# Patient Record
Sex: Female | Born: 1985 | Race: White | Hispanic: No | Marital: Married | Smoking: Never smoker
Health system: Southern US, Community
[De-identification: ages and names within clinical notes are randomized; demographics above are authoritative.]

## PROBLEM LIST (undated history)

## (undated) DIAGNOSIS — D649 Anemia, unspecified: Secondary | ICD-10-CM

## (undated) DIAGNOSIS — Z789 Other specified health status: Secondary | ICD-10-CM

## (undated) HISTORY — PX: WISDOM TOOTH EXTRACTION: SHX21

---

## 2003-12-08 ENCOUNTER — Other Ambulatory Visit: Admission: RE | Admit: 2003-12-08 | Discharge: 2003-12-08 | Payer: Self-pay | Admitting: Family Medicine

## 2004-03-03 ENCOUNTER — Other Ambulatory Visit: Admission: RE | Admit: 2004-03-03 | Discharge: 2004-03-03 | Payer: Self-pay | Admitting: Family Medicine

## 2004-12-06 ENCOUNTER — Other Ambulatory Visit: Admission: RE | Admit: 2004-12-06 | Discharge: 2004-12-06 | Payer: Self-pay | Admitting: Family Medicine

## 2005-12-12 ENCOUNTER — Ambulatory Visit: Payer: Self-pay | Admitting: Family Medicine

## 2005-12-12 ENCOUNTER — Other Ambulatory Visit: Admission: RE | Admit: 2005-12-12 | Discharge: 2005-12-12 | Payer: Self-pay | Admitting: Family Medicine

## 2006-02-15 ENCOUNTER — Ambulatory Visit: Payer: Self-pay | Admitting: Family Medicine

## 2006-06-16 ENCOUNTER — Ambulatory Visit: Payer: Self-pay | Admitting: Family Medicine

## 2006-12-27 ENCOUNTER — Other Ambulatory Visit: Admission: RE | Admit: 2006-12-27 | Discharge: 2006-12-27 | Payer: Self-pay | Admitting: Family Medicine

## 2006-12-27 ENCOUNTER — Ambulatory Visit: Payer: Self-pay | Admitting: Family Medicine

## 2007-12-31 ENCOUNTER — Other Ambulatory Visit: Admission: RE | Admit: 2007-12-31 | Discharge: 2007-12-31 | Payer: Self-pay | Admitting: Family Medicine

## 2007-12-31 ENCOUNTER — Ambulatory Visit: Payer: Self-pay | Admitting: Family Medicine

## 2008-01-02 ENCOUNTER — Encounter: Admission: RE | Admit: 2008-01-02 | Discharge: 2008-01-02 | Payer: Self-pay | Admitting: Family Medicine

## 2008-06-09 ENCOUNTER — Ambulatory Visit: Payer: Self-pay | Admitting: Family Medicine

## 2008-11-27 ENCOUNTER — Ambulatory Visit: Payer: Self-pay | Admitting: Family Medicine

## 2008-12-31 ENCOUNTER — Ambulatory Visit: Payer: Self-pay | Admitting: Physician Assistant

## 2008-12-31 ENCOUNTER — Other Ambulatory Visit: Admission: RE | Admit: 2008-12-31 | Discharge: 2008-12-31 | Payer: Self-pay | Admitting: Physician Assistant

## 2009-12-31 ENCOUNTER — Ambulatory Visit: Payer: Self-pay | Admitting: Physician Assistant

## 2009-12-31 ENCOUNTER — Other Ambulatory Visit
Admission: RE | Admit: 2009-12-31 | Discharge: 2009-12-31 | Payer: Self-pay | Source: Home / Self Care | Admitting: Family Medicine

## 2010-06-20 ENCOUNTER — Emergency Department (HOSPITAL_BASED_OUTPATIENT_CLINIC_OR_DEPARTMENT_OTHER)
Admission: EM | Admit: 2010-06-20 | Discharge: 2010-06-20 | Disposition: A | Discharge status: Home / Self Care | Payer: Managed Care, Other (non HMO) | Attending: Emergency Medicine | Admitting: Emergency Medicine

## 2010-06-20 DIAGNOSIS — L255 Unspecified contact dermatitis due to plants, except food: Secondary | ICD-10-CM | POA: Insufficient documentation

## 2010-06-20 DIAGNOSIS — R220 Localized swelling, mass and lump, head: Secondary | ICD-10-CM | POA: Insufficient documentation

## 2010-12-01 ENCOUNTER — Telehealth: Payer: Self-pay | Admitting: Family Medicine

## 2010-12-01 ENCOUNTER — Telehealth: Payer: Self-pay | Admitting: Internal Medicine

## 2010-12-01 MED ORDER — NORGESTIM-ETH ESTRAD TRIPHASIC 0.18/0.215/0.25 MG-35 MCG PO TABS: 1.0000 | ORAL_TABLET | Freq: Every day | ORAL | Status: DC

## 2010-12-01 NOTE — Telephone Encounter (Signed)
Don't let her run out but she needs an appointment 

## 2010-12-01 NOTE — Telephone Encounter (Signed)
Pt refilled tri-sprintec

## 2010-12-01 NOTE — Telephone Encounter (Signed)
Refill req for tri sprintec mg tab #84

## 2010-12-01 NOTE — Telephone Encounter (Signed)
I refilled tri-sprintec not pt refilled.

## 2012-12-21 ENCOUNTER — Ambulatory Visit (HOSPITAL_COMMUNITY)
Admission: RE | Admit: 2012-12-21 | Discharge: 2012-12-21 | Disposition: A | Discharge status: Home / Self Care | Payer: Managed Care, Other (non HMO) | Source: Ambulatory Visit | Attending: Obstetrics and Gynecology | Admitting: Obstetrics and Gynecology

## 2014-03-28 ENCOUNTER — Inpatient Hospital Stay (HOSPITAL_COMMUNITY)
Admission: AD | Admit: 2014-03-28 | Discharge: 2014-03-29 | Disposition: A | Discharge status: Home / Self Care | Payer: Managed Care, Other (non HMO) | Source: Ambulatory Visit | Attending: Obstetrics and Gynecology | Admitting: Obstetrics and Gynecology

## 2014-03-28 ENCOUNTER — Encounter (HOSPITAL_COMMUNITY): Payer: Self-pay | Admitting: *Deleted

## 2014-03-28 DIAGNOSIS — Z3A34 34 weeks gestation of pregnancy: Secondary | ICD-10-CM | POA: Diagnosis not present

## 2014-03-28 DIAGNOSIS — O9989 Other specified diseases and conditions complicating pregnancy, childbirth and the puerperium: Secondary | ICD-10-CM | POA: Diagnosis not present

## 2014-03-28 DIAGNOSIS — R030 Elevated blood-pressure reading, without diagnosis of hypertension: Secondary | ICD-10-CM | POA: Diagnosis not present

## 2014-03-28 DIAGNOSIS — IMO0001 Reserved for inherently not codable concepts without codable children: Secondary | ICD-10-CM

## 2014-03-28 HISTORY — DX: Other specified health status: Z78.9

## 2014-03-28 LAB — URINALYSIS, ROUTINE W REFLEX MICROSCOPIC
Bilirubin Urine: NEGATIVE
GLUCOSE, UA: NEGATIVE mg/dL
HGB URINE DIPSTICK: NEGATIVE
KETONES UR: NEGATIVE mg/dL
Leukocytes, UA: NEGATIVE
Nitrite: NEGATIVE
PROTEIN: NEGATIVE mg/dL
Specific Gravity, Urine: <1.005 — ABNORMAL LOW (ref 1.005–1.030)
UROBILINOGEN UA: 0.2 mg/dL (ref 0.0–1.0)
pH: 6.5 (ref 5.0–8.0)

## 2014-03-28 NOTE — MAU Note (Addendum)
Swelling in feet for couple wks. Tonight has moved up past my knees and my b/p was high tonight. This is first time my b/p has been high. Baby is breech

## 2014-03-29 LAB — COMPREHENSIVE METABOLIC PANEL
ALBUMIN: 2.7 g/dL — AB (ref 3.5–5.2)
ALK PHOS: 150 U/L — AB (ref 39–117)
ALT: 13 U/L (ref 0–35)
ANION GAP: 8 (ref 5–15)
AST: 21 U/L (ref 0–37)
BUN: 7 mg/dL (ref 6–23)
CALCIUM: 8.6 mg/dL (ref 8.4–10.5)
CO2: 23 mmol/L (ref 19–32)
CREATININE: 0.48 mg/dL — AB (ref 0.50–1.10)
Chloride: 107 mmol/L (ref 96–112)
GFR calc non Af Amer: >90 mL/min (ref >90)
Glucose, Bld: 93 mg/dL (ref 70–99)
POTASSIUM: 3.6 mmol/L (ref 3.5–5.1)
Sodium: 138 mmol/L (ref 135–145)
Total Bilirubin: 0.5 mg/dL (ref 0.3–1.2)
Total Protein: 6 g/dL (ref 6.0–8.3)

## 2014-03-29 LAB — CBC
HCT: 29 % — ABNORMAL LOW (ref 36.0–46.0)
Hemoglobin: 9.9 g/dL — ABNORMAL LOW (ref 12.0–15.0)
MCH: 29.3 pg (ref 26.0–34.0)
MCHC: 34.1 g/dL (ref 30.0–36.0)
MCV: 85.8 fL (ref 78.0–100.0)
Platelets: 128 10*3/uL — ABNORMAL LOW (ref 150–400)
RBC: 3.38 MIL/uL — ABNORMAL LOW (ref 3.87–5.11)
RDW: 13.6 % (ref 11.5–15.5)
WBC: 9.7 10*3/uL (ref 4.0–10.5)

## 2014-03-29 LAB — URIC ACID: URIC ACID, SERUM: 4.4 mg/dL (ref 2.4–7.0)

## 2014-03-29 LAB — PROTEIN / CREATININE RATIO, URINE
CREATININE, URINE: 14.52 mg/dL
Total Protein, Urine: <6 mg/dL

## 2014-03-29 LAB — LACTATE DEHYDROGENASE: LDH: 164 U/L (ref 94–250)

## 2014-03-29 NOTE — MAU Provider Note (Signed)
History     CSN: [id number]  Arrival date and time: 03/28/14 2237   First Provider Initiated Contact with Patient 03/29/14 0013      Chief Complaint  Patient presents with  . Leg Swelling  . Hypertension   HPI  Sandy Ramirez is a 29 y.o. female G1P0 at 58w6dwho presents with increased bilateral leg swelling and elevated BP readings at home. She borrowed a BP cuff from a friend and checked two readings that were both greater than 140/90's.  + fetal movement Denies vaginal bleeding    OB History    Gravida Para Term Preterm AB TAB SAB Ectopic Multiple Living   1               Past Medical History  Diagnosis Date  . Medical history non-contributory     Past Surgical History  Procedure Laterality Date  . Wisdom tooth extraction      Family History  Problem Relation Age of Onset  . Diabetes Father     History  Substance Use Topics  . Smoking status: Never Smoker   . Smokeless tobacco: Never Used  . Alcohol Use: No    Allergies:  Allergies  Allergen Reactions  . Grapefruit Concentrate Other (See Comments)    Unknown reaction  . Peanut-Containing Drug Products Other (See Comments)    Unknown reaction  . Pineapple Swelling  . Shellfish Allergy Swelling  . Wheat Bran Swelling    Prescriptions prior to admission  Medication Sig Dispense Refill Last Dose  . Norgestimate-Ethinyl Estradiol Triphasic (ORTHO TRI-CYCLEN,TRINESSA,TRI-SPRINTEC) 0.18/0.215/0.25 MG-35 MCG tablet Take 1 tablet by mouth daily. 1 Package 0    Results for orders placed or performed during the hospital encounter of 03/28/14 (from the past 48 hour(s))  Urinalysis, Routine w reflex microscopic     Status: Abnormal   Collection Time: 03/28/14 10:55 PM  Result Value Ref Range   Color, Urine YELLOW YELLOW   APPearance CLEAR CLEAR   Specific Gravity, Urine <1.005 (L) 1.005 - 1.030   pH 6.5 5.0 - 8.0   Glucose, UA NEGATIVE NEGATIVE mg/dL   Hgb urine dipstick NEGATIVE NEGATIVE    Bilirubin Urine NEGATIVE NEGATIVE   Ketones, ur NEGATIVE NEGATIVE mg/dL   Protein, ur NEGATIVE NEGATIVE mg/dL   Urobilinogen, UA 0.2 0.0 - 1.0 mg/dL   Nitrite NEGATIVE NEGATIVE   Leukocytes, UA NEGATIVE NEGATIVE    Comment: MICROSCOPIC NOT DONE ON URINES WITH NEGATIVE PROTEIN, BLOOD, LEUKOCYTES, NITRITE, OR GLUCOSE <1000 mg/dL.  Protein / creatinine ratio, urine     Status: None   Collection Time: 03/28/14 10:55 PM  Result Value Ref Range   Creatinine, Urine 14.52 mg/dL   Total Protein, Urine <6.00 mg/dL    Comment: REPEATED TO VERIFY NO NORMAL RANGE ESTABLISHED FOR THIS TEST    Protein Creatinine Ratio        0.00 - 0.15    Comment: RESULT BELOW REPORTABLE RANGE, UNABLE TO CALCULATE.   CBC     Status: Abnormal   Collection Time: 03/28/14 11:59 PM  Result Value Ref Range   WBC 9.7 4.0 - 10.5 K/uL   RBC 3.38 (L) 3.87 - 5.11 MIL/uL   Hemoglobin 9.9 (L) 12.0 - 15.0 g/dL   HCT 29.0 (L) 36.0 - 46.0 %   MCV 85.8 78.0 - 100.0 fL   MCH 29.3 26.0 - 34.0 pg   MCHC 34.1 30.0 - 36.0 g/dL   RDW 13.6 11.5 - 15.5 %   Platelets 128 (  L) 150 - 400 K/uL  Comprehensive metabolic panel     Status: Abnormal   Collection Time: 03/28/14 11:59 PM  Result Value Ref Range   Sodium 138 135 - 145 mmol/L   Potassium 3.6 3.5 - 5.1 mmol/L   Chloride 107 96 - 112 mmol/L   CO2 23 19 - 32 mmol/L   Glucose, Bld 93 70 - 99 mg/dL   BUN 7 6 - 23 mg/dL   Creatinine, Ser 0.48 (L) 0.50 - 1.10 mg/dL   Calcium 8.6 8.4 - 10.5 mg/dL   Total Protein 6.0 6.0 - 8.3 g/dL   Albumin 2.7 (L) 3.5 - 5.2 g/dL   AST 21 0 - 37 U/L   ALT 13 0 - 35 U/L   Alkaline Phosphatase 150 (H) 39 - 117 U/L   Total Bilirubin 0.5 0.3 - 1.2 mg/dL   GFR calc non Af Amer >90 >90 mL/min   GFR calc Af Amer >90 >90 mL/min    Comment: (NOTE) The eGFR has been calculated using the CKD EPI equation. This calculation has not been validated in all clinical situations. eGFR's persistently <90 mL/min signify possible Chronic Kidney Disease.     Anion gap 8 5 - 15  Uric acid     Status: None   Collection Time: 03/28/14 11:59 PM  Result Value Ref Range   Uric Acid, Serum 4.4 2.4 - 7.0 mg/dL  Lactate dehydrogenase     Status: None   Collection Time: 03/28/14 11:59 PM  Result Value Ref Range   LDH 164 94 - 250 U/L        Review of Systems  Constitutional: Negative for fever and chills.  Eyes: Negative for blurred vision and double vision.  Cardiovascular: Negative for chest pain.  Gastrointestinal: Negative for abdominal pain.  Neurological: Negative for headaches.   Physical Exam   Blood pressure 123/79, pulse 93, temperature 98.6 F (37 C), temperature source Oral, resp. rate 16, height $RemoveBe'5\' 6"'gfRVkerNT$  (1.676 m), weight 72.938 kg (160 lb 12.8 oz), SpO2 98 %.  Today's Vitals   03/29/14 0030 03/29/14 0035 03/29/14 0036 03/29/14 0136  BP:   127/80 123/79  Pulse: 97 95 90 93  Temp:    98.6 F (37 C)  TempSrc:    Oral  Resp:    16  Height:      Weight:      SpO2: 100% 98%        Physical Exam  Constitutional: She is oriented to person, place, and time. She appears well-developed and well-nourished.  HENT:  Head: Normocephalic.  Eyes: Pupils are equal, round, and reactive to light.  Neck: Neck supple.  Cardiovascular: Normal rate.   Respiratory: Effort normal. No respiratory distress.  GI: Soft. She exhibits no distension. There is no tenderness.  Musculoskeletal: Normal range of motion.       Right ankle: She exhibits swelling (+1 pitting edema ).       Left ankle: She exhibits swelling (+ 1 pitting edema ).  Neurological: She is alert and oriented to person, place, and time. She has normal reflexes.  Skin: Skin is warm.  Psychiatric: Her behavior is normal.   Fetal Tracing: Baseline: 120 bpm  Variability: Moderate  Accelerations: 15x15 Decelerations: none Toco: none    MAU Course  Procedures  None  MDM  CBC CMET Uric Acid LDH  UA protien creatine ratio; normal  NST  BP readings  Q15  Discussed patient with Dr. Melba Coon BP readings normal prior to  DC home   Assessment and Plan   A:  1. Elevated BP     P:  Discharge home in stable condition Preeclampsia precautions Kick counts Follow up with Dr. Melba Coon as scheduled Return to MAU if symptoms worsen   Lezlie Lye, NP 03/29/2014 4:10 AM

## 2014-04-02 ENCOUNTER — Inpatient Hospital Stay (HOSPITAL_COMMUNITY): Payer: Managed Care, Other (non HMO)

## 2014-04-02 ENCOUNTER — Inpatient Hospital Stay (HOSPITAL_COMMUNITY)
Admission: AD | Admit: 2014-04-02 | Discharge: 2014-04-04 | DRG: 766 | Disposition: A | Discharge status: Home / Self Care | Payer: Managed Care, Other (non HMO) | Source: Ambulatory Visit | Attending: Obstetrics and Gynecology | Admitting: Obstetrics and Gynecology

## 2014-04-02 ENCOUNTER — Inpatient Hospital Stay (HOSPITAL_COMMUNITY): Payer: Managed Care, Other (non HMO) | Admitting: Anesthesiology

## 2014-04-02 ENCOUNTER — Encounter (HOSPITAL_COMMUNITY): Payer: Self-pay | Admitting: *Deleted

## 2014-04-02 ENCOUNTER — Encounter (HOSPITAL_COMMUNITY)
Admission: AD | Disposition: A | Discharge status: Home / Self Care | Payer: Self-pay | Source: Ambulatory Visit | Attending: Obstetrics and Gynecology

## 2014-04-02 DIAGNOSIS — Z98891 History of uterine scar from previous surgery: Secondary | ICD-10-CM

## 2014-04-02 DIAGNOSIS — Z3A35 35 weeks gestation of pregnancy: Secondary | ICD-10-CM | POA: Insufficient documentation

## 2014-04-02 DIAGNOSIS — Z3689 Encounter for other specified antenatal screening: Secondary | ICD-10-CM | POA: Insufficient documentation

## 2014-04-02 DIAGNOSIS — O321XX0 Maternal care for breech presentation, not applicable or unspecified: Secondary | ICD-10-CM | POA: Diagnosis present

## 2014-04-02 DIAGNOSIS — D259 Leiomyoma of uterus, unspecified: Secondary | ICD-10-CM | POA: Diagnosis not present

## 2014-04-02 DIAGNOSIS — O3413 Maternal care for benign tumor of corpus uteri, third trimester: Secondary | ICD-10-CM | POA: Diagnosis not present

## 2014-04-02 DIAGNOSIS — Z3403 Encounter for supervision of normal first pregnancy, third trimester: Secondary | ICD-10-CM | POA: Diagnosis present

## 2014-04-02 DIAGNOSIS — O42013 Preterm premature rupture of membranes, onset of labor within 24 hours of rupture, third trimester: Secondary | ICD-10-CM | POA: Insufficient documentation

## 2014-04-02 LAB — CBC
HEMATOCRIT: 30.8 % — AB (ref 36.0–46.0)
HEMOGLOBIN: 10.4 g/dL — AB (ref 12.0–15.0)
MCH: 28.8 pg (ref 26.0–34.0)
MCHC: 33.8 g/dL (ref 30.0–36.0)
MCV: 85.3 fL (ref 78.0–100.0)
Platelets: 138 10*3/uL — ABNORMAL LOW (ref 150–400)
RBC: 3.61 MIL/uL — AB (ref 3.87–5.11)
RDW: 13.5 % (ref 11.5–15.5)
WBC: 11.7 10*3/uL — AB (ref 4.0–10.5)

## 2014-04-02 LAB — TYPE AND SCREEN
ABO/RH(D): A POS
ANTIBODY SCREEN: NEGATIVE

## 2014-04-02 SURGERY — Surgical Case
Anesthesia: Spinal | Site: Abdomen

## 2014-04-02 MED ORDER — LACTATED RINGERS IV SOLN
INTRAVENOUS | Status: DC
  Administered 2014-04-02 (×2): via INTRAVENOUS

## 2014-04-02 MED ORDER — LACTATED RINGERS IV BOLUS (SEPSIS)
1000.0000 mL | Freq: Once | INTRAVENOUS | Status: AC
  Administered 2014-04-02: 1000 mL via INTRAVENOUS

## 2014-04-02 MED ORDER — ONDANSETRON HCL 4 MG/2ML IJ SOLN
INTRAMUSCULAR | Status: DC | PRN
  Administered 2014-04-02: 4 mg via INTRAVENOUS

## 2014-04-02 MED ORDER — FENTANYL CITRATE 0.05 MG/ML IJ SOLN
INTRAMUSCULAR | Status: AC
  Filled 2014-04-02: qty 2

## 2014-04-02 MED ORDER — ONDANSETRON HCL 4 MG/2ML IJ SOLN
INTRAMUSCULAR | Status: AC
  Filled 2014-04-02: qty 2

## 2014-04-02 MED ORDER — PHENYLEPHRINE 8 MG IN D5W 100 ML (0.08MG/ML) PREMIX OPTIME
INJECTION | INTRAVENOUS | Status: AC
  Filled 2014-04-02: qty 100

## 2014-04-02 MED ORDER — MEPERIDINE HCL 25 MG/ML IJ SOLN
INTRAMUSCULAR | Status: DC | PRN
  Administered 2014-04-02: 25 mg via INTRAVENOUS

## 2014-04-02 MED ORDER — MORPHINE SULFATE (PF) 0.5 MG/ML IJ SOLN
INTRAMUSCULAR | Status: DC | PRN
  Administered 2014-04-02: 150 ug via EPIDURAL

## 2014-04-02 MED ORDER — OXYTOCIN 10 UNIT/ML IJ SOLN
40.0000 [IU] | INTRAVENOUS | Status: DC | PRN
  Administered 2014-04-02: 40 [IU] via INTRAVENOUS

## 2014-04-02 MED ORDER — SCOPOLAMINE 1 MG/3DAYS TD PT72
1.0000 | MEDICATED_PATCH | TRANSDERMAL | Status: DC
  Administered 2014-04-02: 1.5 mg via TRANSDERMAL

## 2014-04-02 MED ORDER — MEPERIDINE HCL 25 MG/ML IJ SOLN
INTRAMUSCULAR | Status: AC
  Filled 2014-04-02: qty 1

## 2014-04-02 MED ORDER — CEFAZOLIN SODIUM-DEXTROSE 2-3 GM-% IV SOLR
2.0000 g | INTRAVENOUS | Status: AC
  Administered 2014-04-02: 2 g via INTRAVENOUS
  Filled 2014-04-02: qty 50

## 2014-04-02 MED ORDER — PHENYLEPHRINE 8 MG IN D5W 100 ML (0.08MG/ML) PREMIX OPTIME
INJECTION | INTRAVENOUS | Status: DC | PRN
  Administered 2014-04-02: 60 ug/min via INTRAVENOUS

## 2014-04-02 MED ORDER — KETOROLAC TROMETHAMINE 30 MG/ML IJ SOLN
30.0000 mg | Freq: Once | INTRAMUSCULAR | Status: AC | PRN
  Administered 2014-04-03: 30 mg via INTRAVENOUS

## 2014-04-02 MED ORDER — FAMOTIDINE IN NACL 20-0.9 MG/50ML-% IV SOLN
20.0000 mg | Freq: Once | INTRAVENOUS | Status: AC
  Administered 2014-04-02: 20 mg via INTRAVENOUS
  Filled 2014-04-02: qty 50

## 2014-04-02 MED ORDER — OXYTOCIN 10 UNIT/ML IJ SOLN
INTRAMUSCULAR | Status: AC
  Filled 2014-04-02: qty 4

## 2014-04-02 MED ORDER — MORPHINE SULFATE 0.5 MG/ML IJ SOLN
INTRAMUSCULAR | Status: AC
  Filled 2014-04-02: qty 10

## 2014-04-02 MED ORDER — FENTANYL CITRATE 0.05 MG/ML IJ SOLN
INTRAMUSCULAR | Status: DC | PRN
  Administered 2014-04-02: 15 ug via INTRATHECAL

## 2014-04-02 MED ORDER — FENTANYL CITRATE 0.05 MG/ML IJ SOLN
25.0000 ug | INTRAMUSCULAR | Status: DC | PRN
  Administered 2014-04-03 (×2): 50 ug via INTRAVENOUS

## 2014-04-02 MED ORDER — MEPERIDINE HCL 25 MG/ML IJ SOLN: 6.2500 mg | INTRAMUSCULAR | Status: DC | PRN

## 2014-04-02 MED ORDER — SCOPOLAMINE 1 MG/3DAYS TD PT72
MEDICATED_PATCH | TRANSDERMAL | Status: AC
  Administered 2014-04-02: 1.5 mg via TRANSDERMAL
  Filled 2014-04-02: qty 1

## 2014-04-02 MED ORDER — CITRIC ACID-SODIUM CITRATE 334-500 MG/5ML PO SOLN
30.0000 mL | Freq: Once | ORAL | Status: AC
  Administered 2014-04-02: 30 mL via ORAL
  Filled 2014-04-02: qty 15

## 2014-04-02 SURGICAL SUPPLY — 35 items
BENZOIN TINCTURE PRP APPL 2/3 (GAUZE/BANDAGES/DRESSINGS) ×3 IMPLANT
CLAMP CORD UMBIL (MISCELLANEOUS) IMPLANT
CLOSURE WOUND 1/2 X4 (GAUZE/BANDAGES/DRESSINGS) ×1
CLOTH BEACON ORANGE TIMEOUT ST (SAFETY) ×3 IMPLANT
CONTAINER PREFILL 10% NBF 15ML (MISCELLANEOUS) IMPLANT
DRAPE SHEET LG 3/4 BI-LAMINATE (DRAPES) IMPLANT
DRSG OPSITE POSTOP 4X10 (GAUZE/BANDAGES/DRESSINGS) ×3 IMPLANT
DRSG VASELINE 3X18 (GAUZE/BANDAGES/DRESSINGS) ×3 IMPLANT
DURAPREP 26ML APPLICATOR (WOUND CARE) ×3 IMPLANT
ELECT REM PT RETURN 9FT ADLT (ELECTROSURGICAL) ×3
ELECTRODE REM PT RTRN 9FT ADLT (ELECTROSURGICAL) ×1 IMPLANT
EXTRACTOR VACUUM KIWI (MISCELLANEOUS) IMPLANT
EXTRACTOR VACUUM M CUP 4 TUBE (SUCTIONS) IMPLANT
EXTRACTOR VACUUM M CUP 4' TUBE (SUCTIONS)
GLOVE BIO SURGEON STRL SZ7.5 (GLOVE) ×3 IMPLANT
GOWN STRL REUS W/TWL LRG LVL3 (GOWN DISPOSABLE) ×6 IMPLANT
KIT ABG SYR 3ML LUER SLIP (SYRINGE) IMPLANT
NEEDLE HYPO 25X5/8 SAFETYGLIDE (NEEDLE) IMPLANT
NS IRRIG 1000ML POUR BTL (IV SOLUTION) ×3 IMPLANT
PACK C SECTION WH (CUSTOM PROCEDURE TRAY) ×3 IMPLANT
PAD ABD 8X7 1/2 STERILE (GAUZE/BANDAGES/DRESSINGS) ×3 IMPLANT
PAD OB MATERNITY 4.3X12.25 (PERSONAL CARE ITEMS) ×3 IMPLANT
RTRCTR C-SECT PINK 25CM LRG (MISCELLANEOUS) ×3 IMPLANT
SPONGE GAUZE 4X4 12PLY STER LF (GAUZE/BANDAGES/DRESSINGS) ×3 IMPLANT
STAPLER VISISTAT 35W (STAPLE) IMPLANT
STRIP CLOSURE SKIN 1/2X4 (GAUZE/BANDAGES/DRESSINGS) ×2 IMPLANT
SUT PLAIN 0 NONE (SUTURE) IMPLANT
SUT VIC AB 0 CT1 36 (SUTURE) ×24 IMPLANT
SUT VIC AB 3-0 CTX 36 (SUTURE) ×3 IMPLANT
SUT VIC AB 3-0 SH 27 (SUTURE)
SUT VIC AB 3-0 SH 27X BRD (SUTURE) IMPLANT
SUT VIC AB 4-0 KS 27 (SUTURE) ×3 IMPLANT
SUT VICRYL 0 TIES 12 18 (SUTURE) IMPLANT
TOWEL OR 17X24 6PK STRL BLUE (TOWEL DISPOSABLE) ×3 IMPLANT
TRAY FOLEY CATH 14FR (SET/KITS/TRAYS/PACK) ×3 IMPLANT

## 2014-04-02 NOTE — Anesthesia Procedure Notes (Signed)
Spinal Patient location during procedure: OB Start time: 04/02/2014 10:15 PM End time: 04/02/2014 10:32 PM Staffing Anesthesiologist: Magnus Ivan Performed by: anesthesiologist  Preanesthetic Checklist Completed: patient identified, surgical consent, pre-op evaluation, timeout performed, IV checked, risks and benefits discussed and monitors and equipment checked Spinal Block Patient position: sitting Prep: site prepped and draped and DuraPrep Patient monitoring: heart rate, cardiac monitor, continuous pulse ox and blood pressure Approach: midline Location: L3-4 Injection technique: single-shot Needle Needle type: Pencan  Needle gauge: 24 G Needle length: 10 cm Assessment Sensory level: T4 Additional Notes CSF aspirated freely pre and post injection.  Patient tolerated well.

## 2014-04-02 NOTE — Anesthesia Preprocedure Evaluation (Addendum)
Anesthesia Evaluation  Patient identified by MRN, date of birth, ID band Patient awake    Reviewed: Allergy & Precautions, NPO status , Patient's Chart, lab work & pertinent test results  History of Anesthesia Complications Negative for: history of anesthetic complications  Airway Mallampati: I  TM Distance: >3 FB Neck ROM: Full    Dental no notable dental hx.    Pulmonary neg pulmonary ROS,  breath sounds clear to auscultation  Pulmonary exam normal       Cardiovascular negative cardio ROS  Rhythm:Regular Rate:Normal     Neuro/Psych negative neurological ROS  negative psych ROS   GI/Hepatic negative GI ROS, Neg liver ROS, GERD-  ,  Endo/Other  negative endocrine ROS  Renal/GU negative Renal ROS  negative genitourinary   Musculoskeletal negative musculoskeletal ROS (+)   Abdominal Normal abdominal exam  (+)   Peds negative pediatric ROS (+)  Hematology negative hematology ROS (+)   Anesthesia Other Findings Multiple food allergies, no drug allergies  Reproductive/Obstetrics negative OB ROS                            Anesthesia Physical Anesthesia Plan  ASA: II and emergent  Anesthesia Plan: Spinal   Post-op Pain Management:    Induction:   Airway Management Planned:   Additional Equipment:   Intra-op Plan:   Post-operative Plan:   Informed Consent: I have reviewed the patients History and Physical, chart, labs and discussed the procedure including the risks, benefits and alternatives for the proposed anesthesia with the patient or authorized representative who has indicated his/her understanding and acceptance.     Plan Discussed with:   Anesthesia Plan Comments: (29 yr old G1 at 85 and 3 days with PPROM, breech presentation for cesarean sections.  Reviewed SAB for cesarean section w/ risk of failure, GETA, spinal HA.  Rare risks of epidural hematoma, infection, nerve  injury.)       Anesthesia Quick Evaluation

## 2014-04-02 NOTE — Transfer of Care (Signed)
Immediate Anesthesia Transfer of Care Note  Patient: Sandy Ramirez  Procedure(s) Performed: Procedure(s): CESAREAN SECTION (N/A)  Patient Location: PACU  Anesthesia Type:Spinal  Level of Consciousness: awake, alert  and oriented  Airway & Oxygen Therapy: Patient Spontanous Breathing  Post-op Assessment: Report given to RN and Post -op Vital signs reviewed and stable  Post vital signs: Reviewed and stable  Last Vitals:  Filed Vitals:   04/02/14 1726  BP: 133/83  Pulse: 102  Temp: 36.8 C  Resp: 16    Complications: No apparent anesthesia complications

## 2014-04-02 NOTE — MAU Note (Addendum)
Patient states she was at home when she had a gush of clear fluid.  States feeling baby move well.  No vaginal bleeding.  Pt reports that baby was breech at her last OB visit  1.5wks ago.

## 2014-04-02 NOTE — Op Note (Signed)
NAMESPECIAL, RANES NO.:  [id number]  MEDICAL RECORD NO.:  [id number]  LOCATION:  [facility]                          FACILITY:  [facility]  PHYSICIAN:  Lucille Passy. Ulanda Edison, M.D. DATE OF BIRTH:  [birth year]  DATE OF PROCEDURE:  04/02/2014 DATE OF DISCHARGE:                              OPERATIVE REPORT   PREOPERATIVE DIAGNOSES:  Intrauterine pregnancy, 35 weeks and 3 days, premature rupture of the membranes, breech presentation.  POSTOPERATIVE DIAGNOSES:  Intrauterine pregnancy, 35 weeks and 3 days, premature rupture of the membranes, breech presentation with small fibroids, a 2-cm fibroid on the left anterior fundus and a smaller fibroid on the posterior aspect of the uterus.  OPERATION:  Low-transverse cervical Cesarean section.  OPERATOR:  Lucille Passy. Ulanda Edison, M.D.  ASSISTANT:  Florian Buff, M.D.  ANESTHESIA:  Spinal anesthesia.  DESCRIPTION OF PROCEDURE:  The patient was brought to the operating room, given a spinal anesthetic by the anesthesiologist, placed supine. The urethra was prepped by the RN.  Foley catheter was inserted to straight drain.  The abdomen was then prepped with DuraPrep and allowed to dry for 3 minutes.  During this time, a time-out was done confirming the patient's name, the procedure to be done.  After a 3-minute delay, waiting for the DuraPrep to dry, the abdomen was draped as a sterile field and anesthesia was confirmed by pinching the lower abdomen with an Allis clamp.  A transverse incision was made and carried in layers through the skin, subcutaneous tissue, and fascia.  The fascia was separated from the rectus muscles superiorly and inferiorly.  Rectus muscle was split in the midline.  Peritoneum was opened vertically.  I used an Chiropodist and ensured that there was nothing caught in the Adeline retractor, exposed the lower uterine segment, made a transverse incision through the superficial layers of the myometrium, entered the  amniotic sac with my finger, pulled superiorly and inferiorly and delivered the breech from the LST position without difficulty, delivering the buttocks, the legs, then each arm individually and the head came out without any problem.  The nose and pharynx were suctioned with the bulb.  The cord was clamped and the infant was taken to the neonatologist by Dr. Elonda Husky.  Cord blood was obtained.  The placenta was then removed.  There was at least 3 pieces of the placenta removed, but at the end of the procedure, I inspected the inside of the uterus and found no fragments of placenta left.  The uterine incision was then closed in 2 layers using a running lock suture of 0 Vicryl on the first layer, nonlocking suture of the same material on the second layer.  A couple of extra figure-of-eight sutures were required for complete hemostasis.  The tubes and ovaries appeared normal.  The uterus was as previously described.  The gutters were blotted free, liberal irrigation confirmed hemostasis and then the abdominal wall was closed in layers using interrupted sutures of 0 Vicryl to close the rectus muscle and peritoneum in 1 layer, two running sutures of 0 Vicryl on the fascia, running 3-0 Vicryl on the subcutaneous tissue, a 4-0 Vicryl on a Keith needle  for the skin.  The patient seemed to tolerate the procedure well.  Blood loss about a 1000 mL.  Sponge and needle counts correct and the patient was returned to recovery in satisfactory condition.     Lucille Passy. Ulanda Edison, M.D.     TFH/MEDQ  D:  04/02/2014  T:  04/02/2014  Job:  [id number]

## 2014-04-02 NOTE — H&P (Signed)
Sandy Ramirez, ULLOA NO.:  [id number]  MEDICAL RECORD NO.:  [id number]  LOCATION:  [facility]                          FACILITY:  [facility]  PHYSICIAN:  Lucille Passy. Ulanda Edison, M.D. DATE OF BIRTH:  11-06-1985  DATE OF ADMISSION:  04/02/2014 DATE OF DISCHARGE:                             HISTORY & PHYSICAL   PRESENT ILLNESS:  This is a 29 year old white female, para 0, gravida 1, at 35 weeks and 3 days gestation with an [facility] of May 04, 2014, who is admitted for C-section because of premature rupture of the membranes and breech presentation.  The patient's initial ultrasound was on September 16, 2013, at 7 weeks and 1 day which gave her due date of May 04, 2014. Blood group and type A positive, negative antibody.  RPR nonreactive. Urine culture negative.  Hepatitis B surface antigen negative, HIV negative, GC and Chlamydia negative.  Rubella immune.  Cystic fibrosis screen negative.  First trimester screen 1 in 7 risk for trisomy 46, [facility] placed her at low risk.  I do not see an AFP test recorded in the chart.  One hour Glucola was 104.  Group B strep has not been done. The patient's prenatal course began at [redacted] weeks gestation.  She conceived with Femara with Dr. Karena Addison.  She was bothered by nausea and vomiting in early pregnancy, this gradually cleared and her weight at her last prenatal visit was a 37 pound weight gain from her first visit.  She was in her normal state of health until about 4 p.m. today when she had spontaneous rupture of membranes.  She came to the maternity admission unit, was found to have grossly ruptured membranes.  Ultrasound confirmed that she has a breech presentation, most likely a frank breech.  I have consulted with the anesthesiologist, since she did have a cookie and some juice or some water at 4 o'clock in the afternoon, he recommend waiting 6 hours before doing the surgery.  PAST MEDICAL HISTORY:  None.  SURGICAL HISTORY:  Wisdom teeth  extracted.  ALLERGIES:  BEEF CONTAINING PRODUCTS, GRAPE FRUIT, PEANUT, PINEAPPLE, SHELLFISH, TOMATO, AND WHEAT.  Patient was apparently known to have a miscarriage in December 2014.  FAMILY HISTORY:  Her family history, father has diabetes mellitus.  SOCIAL HISTORY:  The patient was never smoker.  Does not drink, does not take illicit drugs.  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  PHYSICAL EXAMINATION:  VITAL SIGNS:  On admission, temperature 98.3, pulse 102, respirations 16, blood pressure 133/83. 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 HEART:  Normal size and sounds.  No murmurs. LUNGS:  Clear to auscultation.  At her last prenatal visit on March 28, 2014, fundal height is not recorded but on March 20, 2014, the fundal height was 33 cm.  Rupture of membranes has been confirmed.  The cervix was thought to be 1 cm and thick.  Ultrasound shows a breech presentation, most likely a frank breech.  ADMITTING IMPRESSION:  Intrauterine pregnancy, 35 weeks and 3 days with premature rupture of the membranes and breech presentation.  The  patient is admitted for a cesarean section after waiting 6 hours n.p.o.     Lucille Passy. Ulanda Edison, M.D.     TFH/MEDQ  D:  04/02/2014  T:  04/02/2014  Job:  [id number]

## 2014-04-03 ENCOUNTER — Encounter (HOSPITAL_COMMUNITY): Payer: Self-pay

## 2014-04-03 DIAGNOSIS — Z98891 History of uterine scar from previous surgery: Secondary | ICD-10-CM

## 2014-04-03 LAB — CBC
HEMATOCRIT: 28.4 % — AB (ref 36.0–46.0)
HEMOGLOBIN: 9.5 g/dL — AB (ref 12.0–15.0)
MCH: 29.1 pg (ref 26.0–34.0)
MCHC: 33.5 g/dL (ref 30.0–36.0)
MCV: 86.9 fL (ref 78.0–100.0)
PLATELETS: 115 10*3/uL — AB (ref 150–400)
RBC: 3.27 MIL/uL — AB (ref 3.87–5.11)
RDW: 13.7 % (ref 11.5–15.5)
WBC: 12.1 10*3/uL — AB (ref 4.0–10.5)

## 2014-04-03 LAB — RPR: RPR: NONREACTIVE

## 2014-04-03 MED ORDER — CITRIC ACID-SODIUM CITRATE 334-500 MG/5ML PO SOLN: 30.0000 mL | ORAL | Status: DC | PRN

## 2014-04-03 MED ORDER — TETANUS-DIPHTH-ACELL PERTUSSIS 5-2.5-18.5 LF-MCG/0.5 IM SUSP: 0.5000 mL | Freq: Once | INTRAMUSCULAR | Status: DC

## 2014-04-03 MED ORDER — NALBUPHINE HCL 10 MG/ML IJ SOLN: 5.0000 mg | Freq: Once | INTRAMUSCULAR | Status: AC | PRN

## 2014-04-03 MED ORDER — SCOPOLAMINE 1 MG/3DAYS TD PT72: 1.0000 | MEDICATED_PATCH | Freq: Once | TRANSDERMAL | Status: DC

## 2014-04-03 MED ORDER — MEASLES, MUMPS & RUBELLA VAC ~~LOC~~ INJ
0.5000 mL | INJECTION | Freq: Once | SUBCUTANEOUS | Status: DC
  Filled 2014-04-03: qty 0.5

## 2014-04-03 MED ORDER — LACTATED RINGERS IV SOLN
INTRAVENOUS | Status: DC
Start: 2014-04-03 — End: 2014-04-03

## 2014-04-03 MED ORDER — OXYTOCIN 40 UNITS IN LACTATED RINGERS INFUSION - SIMPLE MED: 62.5000 mL/h | INTRAVENOUS | Status: AC

## 2014-04-03 MED ORDER — LACTATED RINGERS IV SOLN: 500.0000 mL | INTRAVENOUS | Status: DC | PRN

## 2014-04-03 MED ORDER — LACTATED RINGERS IV SOLN
INTRAVENOUS | Status: DC
  Administered 2014-04-03: 1000 mL via INTRAVENOUS

## 2014-04-03 MED ORDER — NALOXONE HCL 1 MG/ML IJ SOLN
1.0000 ug/kg/h | INTRAVENOUS | Status: DC | PRN
  Filled 2014-04-03: qty 2

## 2014-04-03 MED ORDER — FENTANYL CITRATE 0.05 MG/ML IJ SOLN
INTRAMUSCULAR | Status: AC
Start: 2014-04-03 — End: 2014-04-03
  Administered 2014-04-03: 50 ug via INTRAVENOUS
  Filled 2014-04-03: qty 2

## 2014-04-03 MED ORDER — WITCH HAZEL-GLYCERIN EX PADS: 1.0000 "application " | MEDICATED_PAD | CUTANEOUS | Status: DC | PRN

## 2014-04-03 MED ORDER — ZOLPIDEM TARTRATE 5 MG PO TABS: 5.0000 mg | ORAL_TABLET | Freq: Every evening | ORAL | Status: DC | PRN

## 2014-04-03 MED ORDER — KETOROLAC TROMETHAMINE 30 MG/ML IJ SOLN
INTRAMUSCULAR | Status: AC
  Administered 2014-04-03: 30 mg via INTRAVENOUS
  Filled 2014-04-03: qty 1

## 2014-04-03 MED ORDER — DIPHENHYDRAMINE HCL 25 MG PO CAPS: 25.0000 mg | ORAL_CAPSULE | Freq: Four times a day (QID) | ORAL | Status: DC | PRN

## 2014-04-03 MED ORDER — DIPHENHYDRAMINE HCL 25 MG PO CAPS: 25.0000 mg | ORAL_CAPSULE | ORAL | Status: DC | PRN

## 2014-04-03 MED ORDER — LIDOCAINE HCL (PF) 1 % IJ SOLN: 30.0000 mL | INTRAMUSCULAR | Status: DC | PRN

## 2014-04-03 MED ORDER — ONDANSETRON HCL 4 MG/2ML IJ SOLN: 4.0000 mg | Freq: Three times a day (TID) | INTRAMUSCULAR | Status: DC | PRN

## 2014-04-03 MED ORDER — SODIUM CHLORIDE 0.9 % IJ SOLN: 3.0000 mL | INTRAMUSCULAR | Status: DC | PRN

## 2014-04-03 MED ORDER — NALOXONE HCL 0.4 MG/ML IJ SOLN: 0.4000 mg | INTRAMUSCULAR | Status: DC | PRN

## 2014-04-03 MED ORDER — MENTHOL 3 MG MT LOZG: 1.0000 | LOZENGE | OROMUCOSAL | Status: DC | PRN

## 2014-04-03 MED ORDER — NALBUPHINE HCL 10 MG/ML IJ SOLN: 5.0000 mg | INTRAMUSCULAR | Status: DC | PRN

## 2014-04-03 MED ORDER — OXYTOCIN BOLUS FROM INFUSION: 500.0000 mL | INTRAVENOUS | Status: DC

## 2014-04-03 MED ORDER — ACETAMINOPHEN 325 MG PO TABS: 650.0000 mg | ORAL_TABLET | ORAL | Status: DC | PRN

## 2014-04-03 MED ORDER — SIMETHICONE 80 MG PO CHEW
80.0000 mg | CHEWABLE_TABLET | Freq: Three times a day (TID) | ORAL | Status: DC
  Administered 2014-04-03 – 2014-04-04 (×5): 80 mg via ORAL
  Filled 2014-04-03 (×4): qty 1

## 2014-04-03 MED ORDER — LACTATED RINGERS IV BOLUS (SEPSIS): 500.0000 mL | Freq: Once | INTRAVENOUS | Status: DC

## 2014-04-03 MED ORDER — NALBUPHINE HCL 10 MG/ML IJ SOLN
5.0000 mg | Freq: Once | INTRAMUSCULAR | Status: AC | PRN
Start: 2014-04-03 — End: 2014-04-03

## 2014-04-03 MED ORDER — OXYCODONE-ACETAMINOPHEN 5-325 MG PO TABS: 2.0000 | ORAL_TABLET | ORAL | Status: DC | PRN

## 2014-04-03 MED ORDER — SIMETHICONE 80 MG PO CHEW
80.0000 mg | CHEWABLE_TABLET | ORAL | Status: DC
  Administered 2014-04-03: 80 mg via ORAL
  Filled 2014-04-03: qty 1

## 2014-04-03 MED ORDER — FLEET ENEMA 7-19 GM/118ML RE ENEM: 1.0000 | ENEMA | RECTAL | Status: DC | PRN

## 2014-04-03 MED ORDER — SENNOSIDES-DOCUSATE SODIUM 8.6-50 MG PO TABS
2.0000 | ORAL_TABLET | ORAL | Status: DC
  Administered 2014-04-03: 2 via ORAL
  Filled 2014-04-03: qty 2

## 2014-04-03 MED ORDER — LANOLIN HYDROUS EX OINT: 1.0000 "application " | TOPICAL_OINTMENT | CUTANEOUS | Status: DC | PRN

## 2014-04-03 MED ORDER — DIBUCAINE 1 % RE OINT: 1.0000 "application " | TOPICAL_OINTMENT | RECTAL | Status: DC | PRN

## 2014-04-03 MED ORDER — SIMETHICONE 80 MG PO CHEW: 80.0000 mg | CHEWABLE_TABLET | ORAL | Status: DC | PRN

## 2014-04-03 MED ORDER — OXYCODONE-ACETAMINOPHEN 5-325 MG PO TABS
1.0000 | ORAL_TABLET | ORAL | Status: DC | PRN
  Administered 2014-04-03: 1 via ORAL
  Filled 2014-04-03 (×2): qty 1

## 2014-04-03 MED ORDER — OXYTOCIN 40 UNITS IN LACTATED RINGERS INFUSION - SIMPLE MED: 62.5000 mL/h | INTRAVENOUS | Status: DC

## 2014-04-03 MED ORDER — OXYCODONE-ACETAMINOPHEN 5-325 MG PO TABS: 1.0000 | ORAL_TABLET | ORAL | Status: DC | PRN

## 2014-04-03 MED ORDER — IBUPROFEN 600 MG PO TABS
600.0000 mg | ORAL_TABLET | Freq: Four times a day (QID) | ORAL | Status: DC
  Administered 2014-04-03 – 2014-04-04 (×5): 600 mg via ORAL
  Filled 2014-04-03 (×7): qty 1

## 2014-04-03 MED ORDER — NALBUPHINE HCL 10 MG/ML IJ SOLN
5.0000 mg | INTRAMUSCULAR | Status: DC | PRN
  Administered 2014-04-03: 5 mg via INTRAVENOUS
  Filled 2014-04-03: qty 1

## 2014-04-03 MED ORDER — ONDANSETRON HCL 4 MG/2ML IJ SOLN: 4.0000 mg | Freq: Four times a day (QID) | INTRAMUSCULAR | Status: DC | PRN

## 2014-04-03 MED ORDER — CEFAZOLIN SODIUM-DEXTROSE 2-3 GM-% IV SOLR
2.0000 g | Freq: Three times a day (TID) | INTRAVENOUS | Status: AC
  Administered 2014-04-03 (×2): 2 g via INTRAVENOUS
  Filled 2014-04-03 (×2): qty 50

## 2014-04-03 MED ORDER — DIPHENHYDRAMINE HCL 50 MG/ML IJ SOLN: 12.5000 mg | INTRAMUSCULAR | Status: DC | PRN

## 2014-04-03 NOTE — Lactation Note (Addendum)
This note was copied from the chart of Sandy Ramirez. Lactation Consultation Note  Patient Name: Sandy Ramirez Date: 04/03/2014 Reason for consult: Follow-up assessment;Difficult latch;Infant < 6lbs;Late preterm infant Mom trying to latch baby without nipple shield. NT helping but reports baby cannot sustain the latch. LC encouraged Mom to use nipple shield. Reviewed how to apply nipple shield. Baby latched much easier with #20 nipple shield on left breast, sustaining a good suckling pattern. Mom reports this was the longest baby nursed on the left breast. Some colostrum present in the nipple shield when baby came off the breast. Hand expressed and received 2 ml of colostrum. FOB gave this to baby by finger feeding using curved tipped syringe. FOB finished feeding with Alimentum formula 3 ml for baby to have a 5 ml supplement this feeding. Plan reviewed again with parents: BF with feeding ques at least every 3 hours. Baby to nurse 15-30 minutes, then Mom to post pump for 15 minutes while FOB gives supplement per LPT guidelines. Advised parents to increase supplement to 10 ml of EBM/formula once baby is over 24 hours.  Parents report understanding. Encouraged to call for assist as needed.   Maternal Data    Feeding Feeding Type: Breast Fed Length of feed: 7 min  LATCH Score/Interventions Latch: Repeated attempts needed to sustain latch, nipple held in mouth throughout feeding, stimulation needed to elicit sucking reflex. (used #20 nipple shield) Intervention(s): Adjust position;Assist with latch  Audible Swallowing: A few with stimulation  Type of Nipple: Everted at rest and after stimulation (short nipple shafts bilateral)  Comfort (Breast/Nipple): Soft / non-tender     Hold (Positioning): Assistance needed to correctly position infant at breast and maintain latch. Intervention(s): Breastfeeding basics reviewed;Support Pillows  LATCH Score: 7  Lactation Tools  Discussed/Used Tools: Nipple Jefferson Fuel;Pump Nipple shield size: 20 Breast pump type: Double-Electric Breast Pump   Consult Status Consult Status: Follow-up Date: 04/04/14 Follow-up type: In-patient    Katrine Coho 04/03/2014, 6:39 PM

## 2014-04-03 NOTE — Lactation Note (Signed)
This note was copied from the chart of Sandy Ramirez. Lactation Consultation Note  Patient Name: Sandy Danahi Reddish GYJEH'U Date: 04/03/2014 Reason for consult: Follow-up assessment;Difficult latch;Infant < 6lbs;Late preterm infant Baby is sleepy and not sustaining the latch when Mom tries to BF. Reviewed LPT behaviors with parents. Demonstrated how to wake baby to BF. Attempted to assist Mom with latching baby but baby could not sustain the latch with LC assist. Initiated #20 nipple shield and after few attempts baby sustained the latch and nursed off/on for 10 minutes, stimulation needed to keep baby nursing. Scant amount of colostrum visible in the nipple shield. Prior to latching baby Mom had pumped receiving about 1 ml of colostrum. LC hand expressed for Mom and was able to obtain an additional 4 ml of colostrum. Demonstrated and assisted FOB to give baby the colostrum via finger feeding using curved tipped syringe. Discussed the parents the importance of supplementing baby with each feeding due to LPT status. Advised to BF with feeding ques and at least every 3 hours. Use the nipple shield to help with latch. Mom to pump every 3 hours for 15 minutes to encourage milk production and to have EBM to supplement, continue hand expression. Supplement baby every 3 hours according to LPT guidelines. Encouraged Mom to call with next feeding for assist using nipple shield on left breast.   Maternal Data    Feeding Feeding Type: Breast Milk Length of feed: 10 min (off/on)  LATCH Score/Interventions Latch: Repeated attempts needed to sustain latch, nipple held in mouth throughout feeding, stimulation needed to elicit sucking reflex. (initiated #20 nipple shield for baby to sustain latch) Intervention(s): Skin to skin;Waking techniques Intervention(s): Adjust position;Assist with latch;Breast massage;Breast compression  Audible Swallowing: None Intervention(s): Skin to skin;Hand expression  Type of  Nipple: Everted at rest and after stimulation (short nipple shafts bilateral)  Comfort (Breast/Nipple): Soft / non-tender     Hold (Positioning): Assistance needed to correctly position infant at breast and maintain latch. Intervention(s): Breastfeeding basics reviewed;Support Pillows;Position options;Skin to skin  LATCH Score: 6  Lactation Tools Discussed/Used Tools: Nipple Jefferson Fuel;Pump Nipple shield size: 20 Breast pump type: Double-Electric Breast Pump (Mom has her own DEBP - Medela PNS)   Consult Status Consult Status: Follow-up Date: 04/03/14 Follow-up type: In-patient    Katrine Coho 04/03/2014, 2:55 PM

## 2014-04-03 NOTE — Lactation Note (Addendum)
This note was copied from the chart of Sandy Ramirez. Lactation Consultation Note New mom had c-section. Generalized edema noted. Areolas w/slight edema. Reverse pressure some helpful. Breast are full and heavy. Hard to hand express, noted colostrum. Encouraged breast massage during BF to assist in expressing BM. Mom had baby in cradle position BF when entered rm. Baby's face buried in breast, popping on and off w/shallow latch. Mom didn't think it was shallow. Mom has short shaft and edema to nipples and areolas hard to compress. Shells given to assist in everting nipples more. Instructed not to wear while breast have edema or engorged. Assisted in football position d/t hurting mom abd.liked position. Obtained deeper latch. Instructed on sandwiching breast to obtain a deeper latch. Baby cont. To pop off and on, mom easily re-latches. Noted breast getting softer from BF.  LPI information sheet reviewed and LPI newborn behavior reviewed. Instructed not to BF longer than 30 min. D/t tiring baby. Importance of keeping baby warm, documenting I&O, and since baby is 5lb. 6oz. Importance of BF every three hours or sooner if cued. Discussed jaundice and BF and having a lot of stools helps decrease jaundice.  FOB at bedside and supportive. Referred to Baby and Me Book in Breastfeeding section Pg. 22-23 for position options and Proper latch demonstration. Mom encouraged to do skin-to-skin. Mom encouraged to waken baby for feeds. Encouraged to call for assistance if needed and to verify proper latch. Hand expression taught to Mom and how it stimulates milk production.  D/t being LPI mom needs to post-pump after BF to stimulate milk production. Mom stated she has her own DEBP and her mom is bringing it this morning and wasn''t going to use ours. Stress the importance of post-pumping. Mom knows to pump q3h for 15-20 min. Mom reports + breast changes w/pregnancy. [place] brochure given w/resources, support groups and [place]  services. Patient Name: Sandy Orlena Garmon [id number]'M Date: 04/03/2014 Reason for consult: Initial assessment   Maternal Data Has patient been taught Hand Expression?: Yes Does the patient have breastfeeding experience prior to this delivery?: No  Feeding Feeding Type: Breast Fed Length of feed: 20 min  LATCH Score/Interventions Latch: Repeated attempts needed to sustain latch, nipple held in mouth throughout feeding, stimulation needed to elicit sucking reflex. Intervention(s): Adjust position;Assist with latch;Breast massage;Breast compression  Audible Swallowing: None Intervention(s): Hand expression;Skin to skin  Type of Nipple: Everted at rest and after stimulation  Comfort (Breast/Nipple): Soft / non-tender     Hold (Positioning): Assistance needed to correctly position infant at breast and maintain latch. Intervention(s): Skin to skin;Position options;Support Pillows;Breastfeeding basics reviewed  LATCH Score: 6  Lactation Tools Discussed/Used Tools: Shells;Pump Breast pump type: Other (comment) (providing her own/doesn't want to use hospital pump)   Consult Status Consult Status: Follow-up Date: 04/03/14 (in pm) Follow-up type: In-patient    Theodoro Kalata 04/03/2014, 3:37 AM

## 2014-04-03 NOTE — Anesthesia Postprocedure Evaluation (Signed)
Anesthesia Post Note  Patient: Sandy Ramirez  Procedure(s) Performed: Procedure(s) (LRB): CESAREAN SECTION (N/A)  Anesthesia type: Spinal  Patient location: Mother/Baby  Post pain: Pain level controlled  Post assessment: Post-op Vital signs reviewed  Last Vitals:  Filed Vitals:   04/03/14 0430  BP: 112/75  Pulse: 75  Temp: 36.6 C  Resp: 18    Post vital signs: Reviewed  Level of consciousness: awake  Complications: No apparent anesthesia complications

## 2014-04-03 NOTE — Addendum Note (Signed)
Addendum  created 04/03/14 0816 by Asher Muir, CRNA   Modules edited: Notes Section   Notes Section:  File: [id number]

## 2014-04-03 NOTE — Anesthesia Postprocedure Evaluation (Signed)
  Anesthesia Post-op Note  Patient: Sandy Ramirez  Procedure(s) Performed: Procedure(s): CESAREAN SECTION (N/A)  Patient Location: PACU  Anesthesia Type:Spinal  Level of Consciousness: awake, alert  and oriented  Airway and Oxygen Therapy: Patient Spontanous Breathing  Post-op Pain: none  Post-op Assessment: Post-op Vital signs reviewed, Patient's Cardiovascular Status Stable, Respiratory Function Stable, Patent Airway, No signs of Nausea or vomiting and Pain level controlled  Post-op Vital Signs: Reviewed and stable  Last Vitals:  Filed Vitals:   04/03/14 0430  BP: 112/75  Pulse: 75  Temp: 36.6 C  Resp: 18    Complications: No apparent anesthesia complications

## 2014-04-04 LAB — CULTURE, BETA STREP (GROUP B ONLY)

## 2014-04-04 LAB — BIRTH TISSUE RECOVERY COLLECTION (PLACENTA DONATION)

## 2014-04-04 MED ORDER — OXYCODONE-ACETAMINOPHEN 5-325 MG PO TABS
1.0000 | ORAL_TABLET | Freq: Four times a day (QID) | ORAL | Status: DC | PRN
Start: 2014-04-04 — End: 2018-09-05

## 2014-04-04 MED ORDER — IBUPROFEN 600 MG PO TABS: 600.0000 mg | ORAL_TABLET | Freq: Four times a day (QID) | ORAL | Status: DC | PRN

## 2014-04-04 NOTE — Discharge Summary (Signed)
NAMEADELFA, Sandy Ramirez                  ACCOUNT NO.:  [id number]  MEDICAL RECORD NO.:  [id number]  LOCATION:  5456                          FACILITY:  [facility]  PHYSICIAN:  Lucille Passy. Ulanda Edison, M.D. DATE OF BIRTH:  06-Apr-1985  DATE OF ADMISSION:  04/02/2014 DATE OF DISCHARGE:  04/04/2014                              DISCHARGE SUMMARY   HOSPITAL COURSE:  This is a 29 year old white female, para 0, gravida 1, EDC May 04, 2014, admitted for cesarean section because of preterm premature rupture of the membranes and breech presentation.  The patient did have a 1 in 7 risk by first trimester screen of Down syndrome. Panorama placed her at low risk.  After admission to the hospital, an ultrasound confirmed breech presentation.  The patient had eaten short time before admission, so the anesthesiologist recommended waiting 6 hours to do the C-section.  After a wait of 6 hours, the patient was taken to the operating room and underwent a low-transverse cervical C- section by Dr. Ulanda Edison with Dr. Marciano Sequin assisting under spinal anesthesia. Baby did well.  Weight 5 pounds 6 ounces.  It was female, has done well since delivery.  The patient's uterus had a small fibroid on the left anterior fundus and a smaller one on the posterior aspect of the uterus. Postoperatively, the patient had normal return to function of ambulation, urinary function, and passage of flatus.  She tolerated diet well and, on the second postop day, is ready for discharge.  Initial hemoglobin 10.4, hematocrit 30.8, white count 11,700, platelet count 138,000, followup hemoglobin was 9.5.  FINAL DIAGNOSES:  Intrauterine pregnancy at 35 weeks and 3 days, preterm premature rupture of the membranes, breech presentation.  OPERATION:  Low-transverse cervical C-section.  ADDITIONAL DIAGNOSIS:  Fibroids.  FINAL CONDITION:  Improved.  INSTRUCTIONS:  Instructions include our regular discharge instruction booklet, the after visit summary, and  prescriptions for Percocet 5/325, 30 tablets, 1 every 6 hours as needed for pain and Motrin 600 mg, 30 tablets, 1 every 6 hours as needed for pain.  The patient is advised to take ferrous sulfate 325 mg twice a day, return to the office in 2 weeks for followup examination, keep her incision covered for 1 week.     Lucille Passy. Ulanda Edison, M.D.     TFH/MEDQ  D:  04/04/2014  T:  04/04/2014  Job:  [id number]

## 2014-04-04 NOTE — Progress Notes (Signed)
Patient ID: Sandy Ramirez, female   DOB: 03-29-1985, 29 y.o.   MRN: [id number] #[id number] afebrile BP normal Output excellent Tolerating a diet and passing flatus For d/c

## 2014-04-04 NOTE — Discharge Instructions (Signed)
booklet °

## 2014-04-05 ENCOUNTER — Ambulatory Visit: Payer: Self-pay

## 2014-04-05 NOTE — Lactation Note (Signed)
This note was copied from the chart of Girl Ameliana Brashear. Lactation Consultation Note   Follow up consult with this mom of a LPI, now just 60 hours old, and 35 6/7 weeks CGA. The baby is amll, weighing 4 lbs 15.9 oz on discharge. Mom initially started with breast feeding, but is pumping and bottle feeding for now. Her milk has transtioned in, and she is able to pump over 30 mls at a time. i advised mom to pump at least every 3 hours around the clock, and to still try the baby at the breast 1-2 times a day, if she liked. Mom will call lactation for questions/concerns and an o/p consult in the near future. Breast care and wet and dirty chart, and milk storage also reviewed.  Patient Name: Girl Arrow Tomko Kalt YBWLS'L Date: 04/05/2014     Maternal Data    Feeding    LATCH Score/Interventions                      Lactation Tools Discussed/Used     Consult Status      Tonna Corner 04/05/2014, 2:46 PM

## 2014-10-21 ENCOUNTER — Other Ambulatory Visit: Payer: Self-pay | Admitting: Obstetrics and Gynecology

## 2014-10-21 DIAGNOSIS — R2231 Localized swelling, mass and lump, right upper limb: Secondary | ICD-10-CM

## 2014-10-24 ENCOUNTER — Other Ambulatory Visit: Payer: Managed Care, Other (non HMO)

## 2014-10-24 ENCOUNTER — Ambulatory Visit
Admission: RE | Admit: 2014-10-24 | Discharge: 2014-10-24 | Disposition: A | Discharge status: Home / Self Care | Payer: Managed Care, Other (non HMO) | Source: Ambulatory Visit | Attending: Obstetrics and Gynecology | Admitting: Obstetrics and Gynecology

## 2014-10-24 DIAGNOSIS — R2231 Localized swelling, mass and lump, right upper limb: Secondary | ICD-10-CM

## 2015-03-05 ENCOUNTER — Other Ambulatory Visit: Payer: Self-pay | Admitting: Obstetrics and Gynecology

## 2015-03-05 DIAGNOSIS — N6310 Unspecified lump in the right breast, unspecified quadrant: Secondary | ICD-10-CM

## 2015-05-29 ENCOUNTER — Ambulatory Visit
Admission: RE | Admit: 2015-05-29 | Discharge: 2015-05-29 | Disposition: A | Discharge status: Home / Self Care | Payer: BLUE CROSS/BLUE SHIELD | Source: Ambulatory Visit | Attending: Obstetrics and Gynecology | Admitting: Obstetrics and Gynecology

## 2015-05-29 DIAGNOSIS — N6310 Unspecified lump in the right breast, unspecified quadrant: Secondary | ICD-10-CM

## 2015-12-18 ENCOUNTER — Other Ambulatory Visit: Payer: Self-pay | Admitting: Obstetrics and Gynecology

## 2015-12-18 DIAGNOSIS — R2231 Localized swelling, mass and lump, right upper limb: Secondary | ICD-10-CM

## 2016-01-01 ENCOUNTER — Ambulatory Visit
Admission: RE | Admit: 2016-01-01 | Discharge: 2016-01-01 | Disposition: A | Discharge status: Home / Self Care | Payer: BLUE CROSS/BLUE SHIELD | Source: Ambulatory Visit | Attending: Obstetrics and Gynecology | Admitting: Obstetrics and Gynecology

## 2016-01-01 DIAGNOSIS — R2231 Localized swelling, mass and lump, right upper limb: Secondary | ICD-10-CM

## 2017-06-06 IMAGING — US RIGHT UPPER EXTREMITY SOFT TISSUE ULTRASOUND LIMITED
1 series · 4 of 4 positions shown · non-contrast
Comparison: Prior exams.

CLINICAL DATA: 30-year-old female presenting for follow-up in
probably benign right axillary mass.

EXAM:
ULTRASOUND RIGHT UPPER EXTREMITY LIMITED
TECHNIQUE: Ultrasound examination of the upper extremity soft tissues was
performed in the area of clinical concern.

[Series 1: right upper extremity soft tissue ultrasound limit · 0.06mm/px · 4 of 4 slices shown]
[im 1/4]
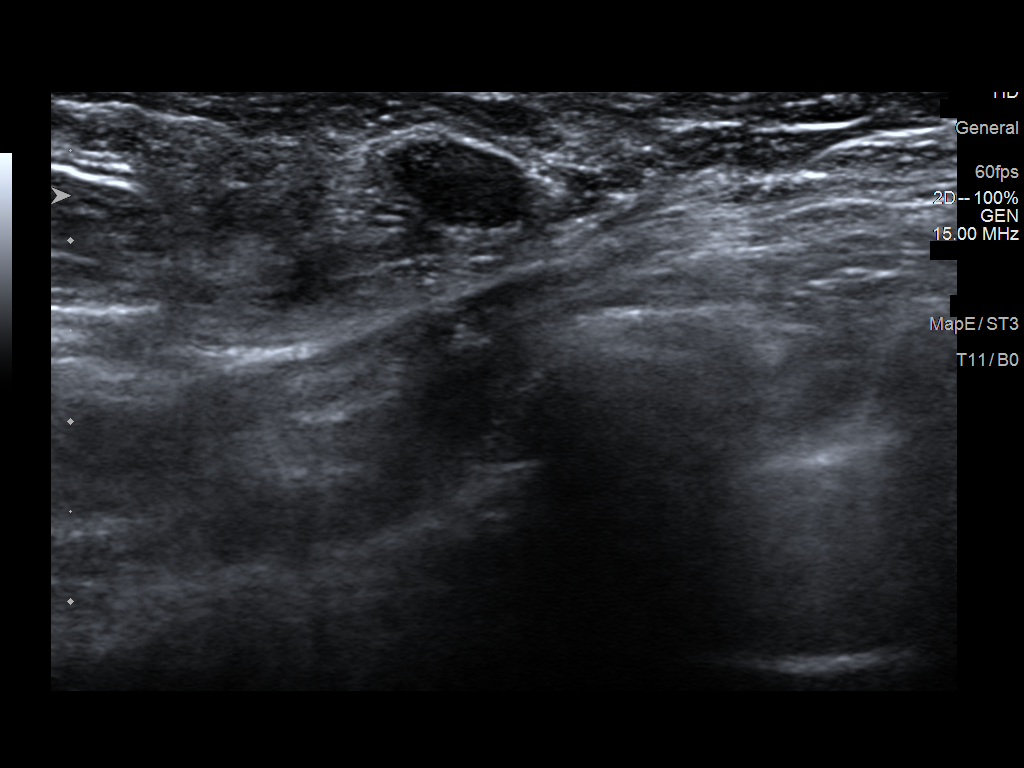
[im 2/4]
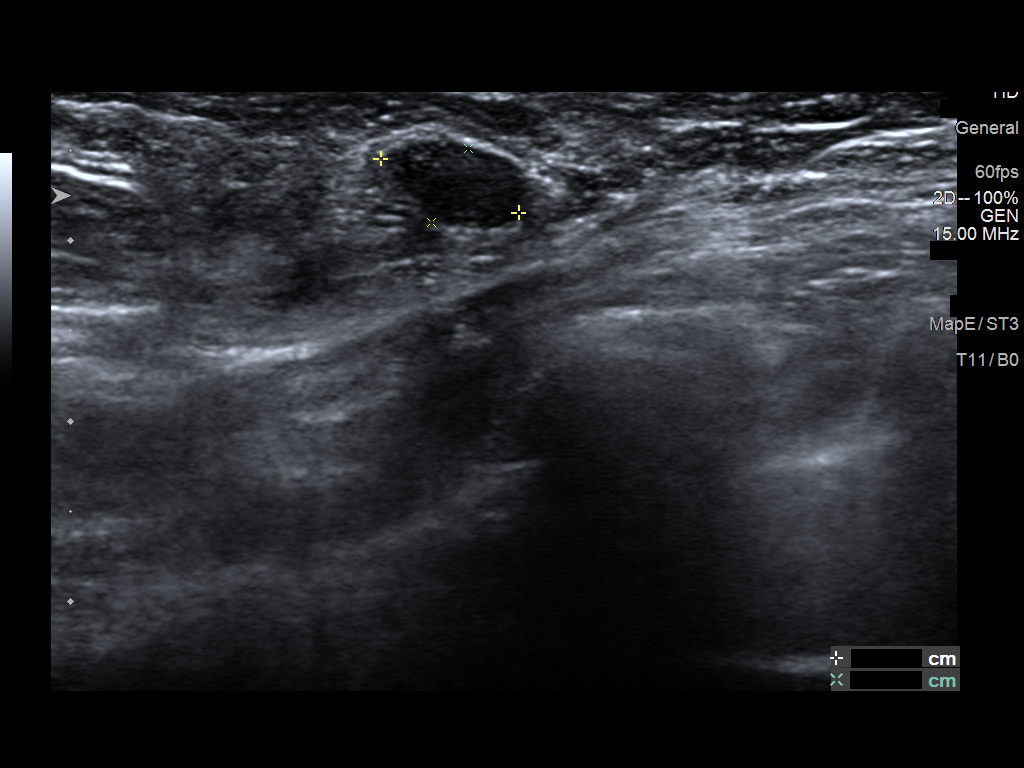
[im 3/4]
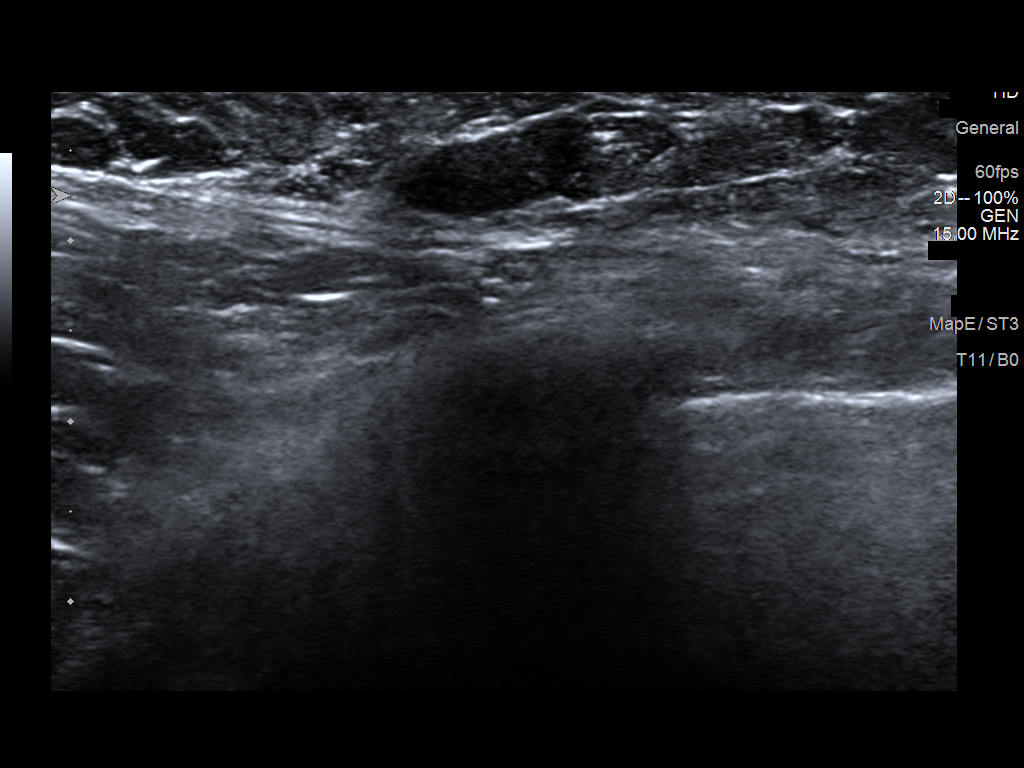
[im 4/4]
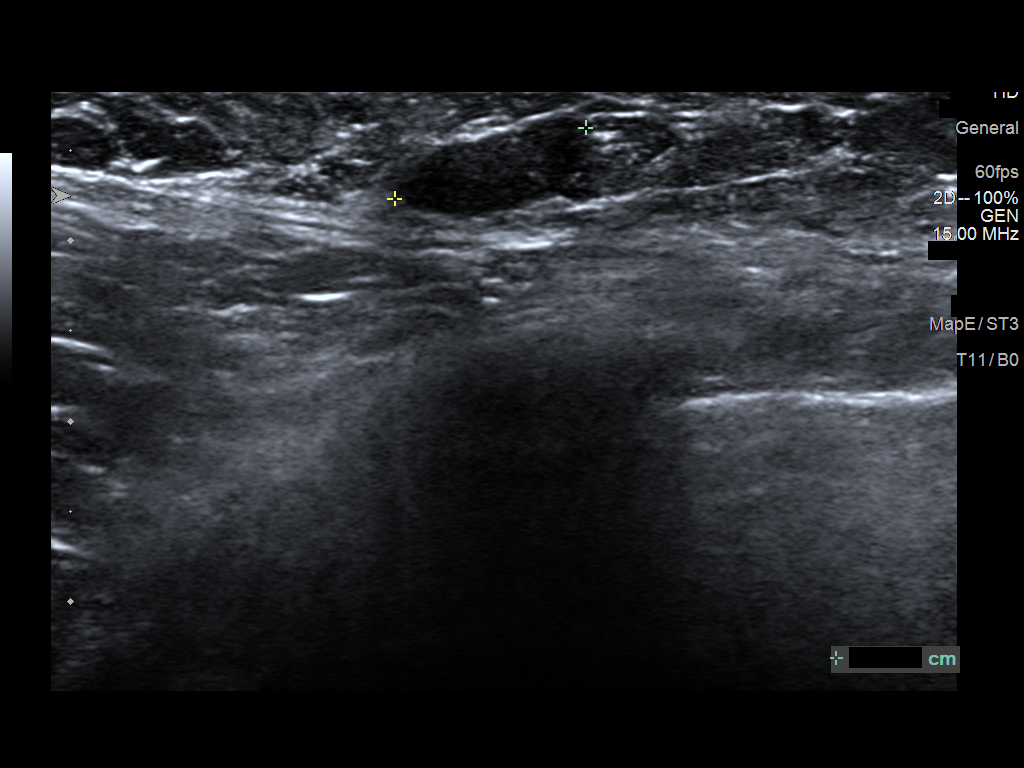

[4 of 4 positions shown; findings below may reference images not displayed]

FINDINGS: The mass in the right axilla has decreased in size of the past year,
currently measuring 1.1 x 0.5 x 0.8 cm, previously measuring 1.6 x
0.4 x 0.9 cm on 10/24/2014.
IMPRESSION: Decrease in size of the right axillary mass consistent with a benign
finding.

Recommendation:

Screening mammography recommended at 40 years old.

BI-RADS 2: Benign.

## 2017-11-17 HISTORY — PX: AUGMENTATION MAMMAPLASTY: SUR837

## 2017-11-17 HISTORY — PX: BREAST SURGERY: SHX581

## 2018-09-04 ENCOUNTER — Other Ambulatory Visit (HOSPITAL_COMMUNITY)
Admission: RE | Admit: 2018-09-04 | Discharge: 2018-09-04 | Disposition: A | Discharge status: Home / Self Care | Payer: 59 | Source: Ambulatory Visit | Attending: Obstetrics and Gynecology | Admitting: Obstetrics and Gynecology

## 2018-09-04 DIAGNOSIS — Z01812 Encounter for preprocedural laboratory examination: Secondary | ICD-10-CM | POA: Insufficient documentation

## 2018-09-04 DIAGNOSIS — Z20828 Contact with and (suspected) exposure to other viral communicable diseases: Secondary | ICD-10-CM | POA: Diagnosis not present

## 2018-09-04 LAB — SARS CORONAVIRUS 2 (TAT 6-24 HRS): SARS Coronavirus 2: NEGATIVE

## 2018-09-05 ENCOUNTER — Other Ambulatory Visit: Payer: Self-pay

## 2018-09-05 ENCOUNTER — Encounter (HOSPITAL_BASED_OUTPATIENT_CLINIC_OR_DEPARTMENT_OTHER): Payer: Self-pay | Admitting: *Deleted

## 2018-09-05 NOTE — Progress Notes (Signed)
Npo after midnight  Arrive 600 am 09-07-18 [facility] No meds to take Needs cbc and urine pregnancy Has surgery orders in epic  Driver spouse Sandy Ramirez cell [phone]

## 2018-09-06 NOTE — Anesthesia Preprocedure Evaluation (Addendum)
Anesthesia Evaluation  Patient identified by MRN, date of birth, ID band Patient awake    Reviewed: Allergy & Precautions, NPO status , Patient's Chart, lab work & pertinent test results  Airway Mallampati: I  TM Distance: >3 FB Neck ROM: Full    Dental no notable dental hx. (+) Teeth Intact   Pulmonary neg pulmonary ROS,    Pulmonary exam normal breath sounds clear to auscultation       Cardiovascular Exercise Tolerance: Good negative cardio ROS Normal cardiovascular exam Rhythm:Regular Rate:Normal     Neuro/Psych negative neurological ROS  negative psych ROS   GI/Hepatic negative GI ROS, Neg liver ROS,   Endo/Other  negative endocrine ROS  Renal/GU negative Renal ROS     Musculoskeletal negative musculoskeletal ROS (+)   Abdominal   Peds  Hematology negative hematology ROS (+)   Anesthesia Other Findings   Reproductive/Obstetrics negative OB ROS                            Anesthesia Physical Anesthesia Plan  ASA: I  Anesthesia Plan: General   Post-op Pain Management:    Induction: Intravenous  PONV Risk Score and Plan: 4 or greater and Treatment may vary due to age or medical condition, Dexamethasone and Ondansetron  Airway Management Planned: Oral ETT  Additional Equipment:   Intra-op Plan:   Post-operative Plan: Extubation in OR  Informed Consent: I have reviewed the patients History and Physical, chart, labs and discussed the procedure including the risks, benefits and alternatives for the proposed anesthesia with the patient or authorized representative who has indicated his/her understanding and acceptance.     Dental advisory given  Plan Discussed with:   Anesthesia Plan Comments:        Anesthesia Quick Evaluation

## 2018-09-06 NOTE — H&P (Signed)
Sandy Ramirez is an 33 y.o. female. She was seen for her annual gyn exam in June, no problems, wants permanent sterility, does not like how she feels on OCP.  Pertinent Gynecological History: Last pap: normal Date: 2016 OB History: G1, P0101   Menstrual History: Patient's last menstrual period was 09/05/2018.    Past Medical History:  Diagnosis Date  . Anemia    after c section 2016  . Medical history non-contributory     Past Surgical History:  Procedure Laterality Date  . BREAST SURGERY Bilateral 11/2017   implants  . CESAREAN SECTION N/A 04/02/2014   Procedure: CESAREAN SECTION;  Surgeon: Newton Pigg, MD;  Location: [facility];  Service: Obstetrics;  Laterality: N/A;  . WISDOM TOOTH EXTRACTION      Family History  Problem Relation Age of Onset  . Diabetes Father     Social History:  reports that she has never smoked. She has never used smokeless tobacco. She reports that she does not drink alcohol or use drugs.  Allergies:  Allergies  Allergen Reactions  . Shellfish Allergy Swelling    No medications prior to admission.    Review of Systems  Respiratory: Negative.   Cardiovascular: Negative.     Height 5\' 6"  (1.676 m), weight 58.1 kg, last menstrual period 09/05/2018, unknown if currently breastfeeding. Physical Exam  Constitutional: She appears well-developed and well-nourished.  Neck: Neck supple. No thyromegaly present.  Cardiovascular: Normal rate, regular rhythm and normal heart sounds.  No murmur heard. Respiratory: Effort normal and breath sounds normal. No respiratory distress. She has no wheezes.  GI: Soft. She exhibits no distension and no mass. There is no abdominal tenderness.  Genitourinary:    Vagina and uterus normal.     Genitourinary Comments: No adnexal mass     No results found for this or any previous visit (from the past 24 hour(s)).  No results found.  Assessment/Plan: Desires permanent sterility.  Discussed all options for  contraception, discussed surgical procedure, risks, failure rate, permanency.  Will admit for laparoscopic bilateral salpingectomy  Sandy Ramirez 09/06/2018, 10:32 PM

## 2018-09-07 ENCOUNTER — Ambulatory Visit (HOSPITAL_BASED_OUTPATIENT_CLINIC_OR_DEPARTMENT_OTHER): Payer: 59 | Admitting: Anesthesiology

## 2018-09-07 ENCOUNTER — Encounter (HOSPITAL_BASED_OUTPATIENT_CLINIC_OR_DEPARTMENT_OTHER)
Admission: RE | Disposition: A | Discharge status: Home / Self Care | Payer: Self-pay | Source: Home / Self Care | Attending: Obstetrics and Gynecology

## 2018-09-07 ENCOUNTER — Ambulatory Visit (HOSPITAL_BASED_OUTPATIENT_CLINIC_OR_DEPARTMENT_OTHER)
Admission: RE | Admit: 2018-09-07 | Discharge: 2018-09-07 | Disposition: A | Discharge status: Home / Self Care | Payer: 59 | Attending: Obstetrics and Gynecology | Admitting: Obstetrics and Gynecology

## 2018-09-07 ENCOUNTER — Other Ambulatory Visit: Payer: Self-pay

## 2018-09-07 ENCOUNTER — Encounter (HOSPITAL_BASED_OUTPATIENT_CLINIC_OR_DEPARTMENT_OTHER): Payer: Self-pay | Admitting: *Deleted

## 2018-09-07 DIAGNOSIS — Z302 Encounter for sterilization: Secondary | ICD-10-CM | POA: Diagnosis not present

## 2018-09-07 HISTORY — DX: Anemia, unspecified: D64.9

## 2018-09-07 HISTORY — PX: LAPAROSCOPIC BILATERAL SALPINGECTOMY: SHX5889

## 2018-09-07 LAB — CBC
HCT: 41.7 % (ref 36.0–46.0)
Hemoglobin: 14.1 g/dL (ref 12.0–15.0)
MCH: 29.7 pg (ref 26.0–34.0)
MCHC: 33.8 g/dL (ref 30.0–36.0)
MCV: 88 fL (ref 80.0–100.0)
Platelets: 250 10*3/uL (ref 150–400)
RBC: 4.74 MIL/uL (ref 3.87–5.11)
RDW: 12.7 % (ref 11.5–15.5)
WBC: 4.9 10*3/uL (ref 4.0–10.5)
nRBC: 0 % (ref 0.0–0.2)

## 2018-09-07 LAB — POCT PREGNANCY, URINE: Preg Test, Ur: NEGATIVE

## 2018-09-07 SURGERY — SALPINGECTOMY, BILATERAL, LAPAROSCOPIC
Anesthesia: General | Laterality: Bilateral

## 2018-09-07 MED ORDER — ROCURONIUM BROMIDE 100 MG/10ML IV SOLN
INTRAVENOUS | Status: DC | PRN
  Administered 2018-09-07: 50 mg via INTRAVENOUS

## 2018-09-07 MED ORDER — SCOPOLAMINE 1 MG/3DAYS TD PT72
1.0000 | MEDICATED_PATCH | TRANSDERMAL | Status: DC
  Administered 2018-09-07: 1.5 mg via TRANSDERMAL
  Filled 2018-09-07: qty 1

## 2018-09-07 MED ORDER — ONDANSETRON HCL 4 MG/2ML IJ SOLN
4.0000 mg | Freq: Once | INTRAMUSCULAR | Status: DC | PRN
  Filled 2018-09-07: qty 2

## 2018-09-07 MED ORDER — FENTANYL CITRATE (PF) 100 MCG/2ML IJ SOLN
INTRAMUSCULAR | Status: AC
  Filled 2018-09-07: qty 2

## 2018-09-07 MED ORDER — MIDAZOLAM HCL 2 MG/2ML IJ SOLN
INTRAMUSCULAR | Status: AC
  Filled 2018-09-07: qty 2

## 2018-09-07 MED ORDER — ONDANSETRON HCL 4 MG/2ML IJ SOLN
INTRAMUSCULAR | Status: DC | PRN
  Administered 2018-09-07: 4 mg via INTRAVENOUS

## 2018-09-07 MED ORDER — DEXAMETHASONE SODIUM PHOSPHATE 4 MG/ML IJ SOLN
INTRAMUSCULAR | Status: DC | PRN
  Administered 2018-09-07: 10 mg via INTRAVENOUS

## 2018-09-07 MED ORDER — MIDAZOLAM HCL 5 MG/5ML IJ SOLN
INTRAMUSCULAR | Status: DC | PRN
  Administered 2018-09-07: 2 mg via INTRAVENOUS

## 2018-09-07 MED ORDER — FENTANYL CITRATE (PF) 100 MCG/2ML IJ SOLN
INTRAMUSCULAR | Status: DC | PRN
  Administered 2018-09-07: 100 ug via INTRAVENOUS

## 2018-09-07 MED ORDER — LIDOCAINE 2% (20 MG/ML) 5 ML SYRINGE
INTRAMUSCULAR | Status: AC
  Filled 2018-09-07: qty 5

## 2018-09-07 MED ORDER — LIDOCAINE HCL (CARDIAC) PF 100 MG/5ML IV SOSY
PREFILLED_SYRINGE | INTRAVENOUS | Status: DC | PRN
  Administered 2018-09-07: 100 mg via INTRAVENOUS

## 2018-09-07 MED ORDER — DEXMEDETOMIDINE HCL IN NACL 200 MCG/50ML IV SOLN
INTRAVENOUS | Status: DC | PRN
  Administered 2018-09-07: 24 ug via INTRAVENOUS

## 2018-09-07 MED ORDER — HYDROCODONE-ACETAMINOPHEN 5-325 MG PO TABS: 1.0000 | ORAL_TABLET | Freq: Four times a day (QID) | ORAL | 0 refills | Status: AC | PRN

## 2018-09-07 MED ORDER — OXYCODONE HCL 5 MG PO TABS
5.0000 mg | ORAL_TABLET | Freq: Once | ORAL | Status: DC | PRN
  Filled 2018-09-07: qty 1

## 2018-09-07 MED ORDER — PROPOFOL 10 MG/ML IV BOLUS
INTRAVENOUS | Status: DC | PRN
  Administered 2018-09-07: 140 mg via INTRAVENOUS

## 2018-09-07 MED ORDER — OXYCODONE HCL 5 MG/5ML PO SOLN
5.0000 mg | Freq: Once | ORAL | Status: DC | PRN
  Filled 2018-09-07: qty 5

## 2018-09-07 MED ORDER — ONDANSETRON HCL 4 MG/2ML IJ SOLN
INTRAMUSCULAR | Status: AC
  Filled 2018-09-07: qty 2

## 2018-09-07 MED ORDER — DEXMEDETOMIDINE HCL IN NACL 200 MCG/50ML IV SOLN
INTRAVENOUS | Status: AC
  Filled 2018-09-07: qty 50

## 2018-09-07 MED ORDER — ACETAMINOPHEN 500 MG PO TABS
ORAL_TABLET | ORAL | Status: AC
  Filled 2018-09-07: qty 2

## 2018-09-07 MED ORDER — LACTATED RINGERS IV SOLN
INTRAVENOUS | Status: DC
  Administered 2018-09-07 (×2): via INTRAVENOUS
  Filled 2018-09-07: qty 1000

## 2018-09-07 MED ORDER — BUPIVACAINE HCL (PF) 0.25 % IJ SOLN
INTRAMUSCULAR | Status: DC | PRN
  Administered 2018-09-07: 8 mL

## 2018-09-07 MED ORDER — LACTATED RINGERS IV SOLN
INTRAVENOUS | Status: DC
  Administered 2018-09-07: 07:00:00 via INTRAVENOUS
  Filled 2018-09-07: qty 1000

## 2018-09-07 MED ORDER — KETOROLAC TROMETHAMINE 30 MG/ML IJ SOLN
30.0000 mg | Freq: Once | INTRAMUSCULAR | Status: DC | PRN
  Filled 2018-09-07: qty 1

## 2018-09-07 MED ORDER — SCOPOLAMINE 1 MG/3DAYS TD PT72
MEDICATED_PATCH | TRANSDERMAL | Status: AC
  Filled 2018-09-07: qty 1

## 2018-09-07 MED ORDER — PROPOFOL 10 MG/ML IV BOLUS
INTRAVENOUS | Status: AC
  Filled 2018-09-07: qty 40

## 2018-09-07 MED ORDER — ACETAMINOPHEN 500 MG PO TABS
1000.0000 mg | ORAL_TABLET | Freq: Once | ORAL | Status: AC
  Administered 2018-09-07: 1000 mg via ORAL
  Filled 2018-09-07: qty 2

## 2018-09-07 MED ORDER — SUGAMMADEX SODIUM 200 MG/2ML IV SOLN
INTRAVENOUS | Status: DC | PRN
  Administered 2018-09-07: 200 mg via INTRAVENOUS

## 2018-09-07 MED ORDER — KETOROLAC TROMETHAMINE 30 MG/ML IJ SOLN
INTRAMUSCULAR | Status: DC | PRN
  Administered 2018-09-07: 30 mg via INTRAVENOUS

## 2018-09-07 MED ORDER — DEXAMETHASONE SODIUM PHOSPHATE 10 MG/ML IJ SOLN
INTRAMUSCULAR | Status: AC
  Filled 2018-09-07: qty 1

## 2018-09-07 MED ORDER — HYDROMORPHONE HCL 1 MG/ML IJ SOLN
0.2500 mg | INTRAMUSCULAR | Status: DC | PRN
  Filled 2018-09-07: qty 0.5

## 2018-09-07 SURGICAL SUPPLY — 33 items
CATH ROBINSON RED A/P 16FR (CATHETERS) ×2 IMPLANT
CHLORAPREP W/TINT 26 (MISCELLANEOUS) ×2 IMPLANT
COVER WAND RF STERILE (DRAPES) ×2 IMPLANT
DERMABOND ADVANCED (GAUZE/BANDAGES/DRESSINGS) ×1
DERMABOND ADVANCED .7 DNX12 (GAUZE/BANDAGES/DRESSINGS) ×1 IMPLANT
DRSG COVADERM PLUS 2X2 (GAUZE/BANDAGES/DRESSINGS) ×2 IMPLANT
DRSG OPSITE POSTOP 3X4 (GAUZE/BANDAGES/DRESSINGS) IMPLANT
DURAPREP 26ML APPLICATOR (WOUND CARE) IMPLANT
GAUZE 4X4 16PLY RFD (DISPOSABLE) ×2 IMPLANT
GLOVE BIOGEL PI IND STRL 8 (GLOVE) ×1 IMPLANT
GLOVE BIOGEL PI INDICATOR 8 (GLOVE) ×1
GLOVE ORTHO TXT STRL SZ7.5 (GLOVE) ×2 IMPLANT
GOWN STRL REUS W/TWL XL LVL3 (GOWN DISPOSABLE) ×2 IMPLANT
HIBICLENS CHG 4% 4OZ (MISCELLANEOUS) ×2 IMPLANT
NEEDLE INSUFFLATION 120MM (ENDOMECHANICALS) ×2 IMPLANT
NS IRRIG 1000ML POUR BTL (IV SOLUTION) ×2 IMPLANT
PACK LAPAROSCOPY BASIN (CUSTOM PROCEDURE TRAY) ×2 IMPLANT
PACK TRENDGUARD 450 HYBRID PRO (MISCELLANEOUS) IMPLANT
PROTECTOR NERVE ULNAR (MISCELLANEOUS) ×2 IMPLANT
SCISSORS LAP 5X45 EPIX DISP (ENDOMECHANICALS) ×2 IMPLANT
SET IRRIG TUBING LAPAROSCOPIC (IRRIGATION / IRRIGATOR) IMPLANT
SHEARS HARMONIC ACE PLUS 36CM (ENDOMECHANICALS) IMPLANT
SUT VICRYL 0 UR6 27IN ABS (SUTURE) IMPLANT
SUT VICRYL 4-0 PS2 18IN ABS (SUTURE) ×2 IMPLANT
SYS BAG RETRIEVAL 10MM (BASKET)
SYSTEM BAG RETRIEVAL 10MM (BASKET) IMPLANT
TOWEL OR 17X26 10 PK STRL BLUE (TOWEL DISPOSABLE) ×2 IMPLANT
TRENDGUARD 450 HYBRID PRO PACK (MISCELLANEOUS)
TROCAR BLADELESS OPT 5 100 (ENDOMECHANICALS) ×2 IMPLANT
TROCAR XCEL BLUNT TIP 100MML (ENDOMECHANICALS) IMPLANT
TROCAR XCEL NON-BLD 11X100MML (ENDOMECHANICALS) ×2 IMPLANT
TUBING EVAC SMOKE HEATED PNEUM (TUBING) ×2 IMPLANT
WARMER LAPAROSCOPE (MISCELLANEOUS) ×2 IMPLANT

## 2018-09-07 NOTE — Anesthesia Postprocedure Evaluation (Signed)
Anesthesia Post Note  Patient: Sandy Ramirez  Procedure(s) Performed: LAPAROSCOPIC BILATERAL SALPINGECTOMY (Bilateral )     Patient location during evaluation: PACU Anesthesia Type: General Level of consciousness: awake and alert Pain management: pain level controlled Vital Signs Assessment: post-procedure vital signs reviewed and stable Respiratory status: spontaneous breathing, nonlabored ventilation, respiratory function stable and patient connected to nasal cannula oxygen Cardiovascular status: blood pressure returned to baseline and stable Postop Assessment: no apparent nausea or vomiting Anesthetic complications: no    Last Vitals:  Vitals:   09/07/18 0900 09/07/18 0915  BP: 99/69 109/81  Pulse: 84 85  Resp: 12 12  Temp:    SpO2: 100% 100%    Last Pain:  Vitals:   09/07/18 0915  TempSrc:   PainSc: 1                  Barnet Glasgow

## 2018-09-07 NOTE — Op Note (Signed)
Preoperative diagnosis: Desires surgical sterility Postoperative diagnosis: Same Procedure: Laparoscopic bilateral salpingectomy Surgeon: Cheri Fowler M.D. Anesthesia: Gen. Endotracheal tube Findings: She had a normal abdomen and pelvis with normal uterus tubes and ovaries Specimens: Bilateral fallopian tubes Estimated blood loss: Minimal Complications: None  Procedure in detail  The patient was taken to the operating room and placed in the dorsosupine position. General anesthesia was induced. Her legs were placed in mobile stirrups and her left arm was tucked to her side. Abdomen perineum and vagina were then prepped and draped in the usual sterile fashion, bladder drained with a red robinson catheter, a Hulka tenaculum was applied to the cervix for uterine manipulation. Infraumbilical skin was then infiltrated with quarter percent Marcaine and a 1 cm vertical incision was made. The veress needle was inserted into the peritoneal cavity and placement confirmed by the water drop test and an opening pressure of 3 mm of mercury. CO2 was insufflated to a pressure of 12 mm of mercury and the veress needle was removed. A 10/11 disposable trocar was then introduced with direct visualization with the laparoscope. A 5 mm port was then placed low in the midline also under direct visualization. Inspection revealed the above-mentioned findings with normal anatomy. The distal end of each tube was grasped and elevated. Using bipolar cautery I was able to free the distal end of each tube from the ovary and fulgurate across the mesosalpinx and a proximal portion of the fallopian tube. Scissors were then used to remove the fallopian tube. This is done bilaterally without difficulty. Both segments of tube were removed through the 71mm trocar. The 5 mm port was removed under direct visualization. All gas was allowed to deflate from the abdomen and the umbilical trocar was removed. One figure-of-eight suture of 0 Vicryl  was placed in the umbilical incision. Skin incisions were then closed with interrupted subcuticular sutures of 4-0 Vicryl followed by Dermabond. The Hulka tenaculum was removed. The patient was taken down from stirrups. She was awakened in the operating room and taken to the recovery room in stable condition after tolerating the procedure well. Counts were correct and she had PAS hose on throughout the procedure.

## 2018-09-07 NOTE — Discharge Instructions (Signed)
Routine instructions for laparoscopy   DISCHARGE INSTRUCTIONS: Laparoscopy  The following instructions have been prepared to help you care for yourself upon your return home today.  Wound care:  Do not get the incision wet for the first 24 hours. The incision should be kept clean and dry.  The Band-Aids or dressings may be removed the day after surgery.  Should the incision become sore, red, and swollen after the first week, check with your doctor.  Personal hygiene:  Shower the day after your procedure.  Activity and limitations:  Do NOT drive or operate any equipment today.  Do NOT lift anything more than 15 pounds for 2-3 weeks after surgery.  Do NOT rest in bed all day.  Walking is encouraged. Walk each day, starting slowly with 5-minute walks 3 or 4 times a day. Slowly increase the length of your walks.  Walk up and down stairs slowly.  Do NOT do strenuous activities, such as golfing, playing tennis, bowling, running, biking, weight lifting, gardening, mowing, or vacuuming for 2-4 weeks. Ask your doctor when it is okay to start.  Diet: Eat a light meal as desired this evening. You may resume your usual diet tomorrow.  Return to work: This is dependent on the type of work you do. For the most part you can return to a desk job within a week of surgery. If you are more active at work, please discuss this with your doctor.  What to expect after your surgery: You may have a slight burning sensation when you urinate on the first day. You may have a very small amount of blood in the urine. Expect to have a small amount of vaginal discharge/light bleeding for 1-2 weeks. It is not unusual to have abdominal soreness and bruising for up to 2 weeks. You may be tired and need more rest for about 1 week. You may experience shoulder pain for 24-72 hours. Lying flat in bed may relieve it.  Call your doctor for any of the following:  Develop a fever of 100.4 or greater  Inability to  urinate 6 hours after discharge from hospital  Severe pain not relieved by pain medications  Persistent of heavy bleeding at incision site  Redness or swelling around incision site after a week  Increasing nausea or vomiting  Patient Signature________________________________________ Nurse Signature_________________________________________    Post Anesthesia Home Care Instructions  Activity: Get plenty of rest for the remainder of the day. A responsible individual must stay with you for 24 hours following the procedure.  For the next 24 hours, DO NOT: -Drive a car -Paediatric nurse -Drink alcoholic beverages -Take any medication unless instructed by your physician -Make any legal decisions or sign important papers.  Meals: Start with liquid foods such as gelatin or soup. Progress to regular foods as tolerated. Avoid greasy, spicy, heavy foods. If nausea and/or vomiting occur, drink only clear liquids until the nausea and/or vomiting subsides. Call your physician if vomiting continues.  Special Instructions/Symptoms: Your throat may feel dry or sore from the anesthesia or the breathing tube placed in your throat during surgery. If this causes discomfort, gargle with warm salt water. The discomfort should disappear within 24 hours.  If you had a scopolamine patch placed behind your ear for the management of post- operative nausea and/or vomiting:  1. The medication in the patch is effective for 72 hours, after which it should be removed.  Wrap patch in a tissue and discard in the trash. Wash hands thoroughly with  soap and water. 2. You may remove the patch earlier than 72 hours if you experience unpleasant side effects which may include dry mouth, dizziness or visual disturbances. 3. Avoid touching the patch. Wash your hands with soap and water after contact with the patch.      NO IBUPROFEN PRODUCTS UNTIL 2:30PM TODAY.

## 2018-09-07 NOTE — Interval H&P Note (Signed)
History and Physical Interval Note:  09/07/2018 7:42 AM  Sandy Ramirez  has presented today for surgery, with the diagnosis of sterilization.  The various methods of treatment have been discussed with the patient and family. After consideration of risks, benefits and other options for treatment, the patient has consented to  Procedure(s): LAPAROSCOPIC BILATERAL SALPINGECTOMY (Bilateral) as a surgical intervention.  The patient's history has been reviewed, patient examined, no change in status, stable for surgery.  I have reviewed the patient's chart and labs.  Questions were answered to the patient's satisfaction.     Blane Ohara Kristy Schomburg

## 2018-09-07 NOTE — Anesthesia Procedure Notes (Signed)
Procedure Name: Intubation Date/Time: 09/07/2018 7:57 AM Performed by: Justice Rocher, CRNA Pre-anesthesia Checklist: Patient identified, Emergency Drugs available, Suction available and Patient being monitored Patient Re-evaluated:Patient Re-evaluated prior to induction Oxygen Delivery Method: Circle system utilized Preoxygenation: Pre-oxygenation with 100% oxygen Induction Type: IV induction Ventilation: Mask ventilation without difficulty Tube type: Oral Tube size: 7.0 mm Number of attempts: 1 Airway Equipment and Method: Stylet and Oral airway Placement Confirmation: ETT inserted through vocal cords under direct vision,  positive ETCO2 and breath sounds checked- equal and bilateral Secured at: 19 cm Tube secured with: Tape Dental Injury: Teeth and Oropharynx as per pre-operative assessment

## 2018-09-07 NOTE — Transfer of Care (Signed)
Immediate Anesthesia Transfer of Care Note  Patient: Sandy Ramirez  Procedure(s) Performed: Procedure(s) (LRB): LAPAROSCOPIC BILATERAL SALPINGECTOMY (Bilateral)  Patient Location: PACU  Anesthesia Type: General  Level of Consciousness: awake, sedated, patient cooperative and responds to stimulation  Airway & Oxygen Therapy: Patient Spontanous Breathing and Patient connected to [place] 02 and soft FM  Post-op Assessment: Report given to PACU RN, Post -op Vital signs reviewed and stable and Patient moving all extremities  Post vital signs: Reviewed and stable  Complications: No apparent anesthesia complications

## 2018-09-10 ENCOUNTER — Encounter (HOSPITAL_BASED_OUTPATIENT_CLINIC_OR_DEPARTMENT_OTHER): Payer: Self-pay | Admitting: Obstetrics and Gynecology

## 2021-08-06 ENCOUNTER — Other Ambulatory Visit: Payer: Self-pay | Admitting: Obstetrics and Gynecology

## 2021-08-06 DIAGNOSIS — N63 Unspecified lump in unspecified breast: Secondary | ICD-10-CM

## 2021-09-03 ENCOUNTER — Ambulatory Visit
Admission: RE | Admit: 2021-09-03 | Discharge: 2021-09-03 | Disposition: A | Discharge status: Home / Self Care | Payer: Managed Care, Other (non HMO) | Source: Ambulatory Visit | Attending: Obstetrics and Gynecology | Admitting: Obstetrics and Gynecology

## 2021-09-03 ENCOUNTER — Other Ambulatory Visit: Payer: Self-pay | Admitting: Obstetrics and Gynecology

## 2021-09-03 ENCOUNTER — Ambulatory Visit
Admission: RE | Admit: 2021-09-03 | Discharge: 2021-09-03 | Disposition: A | Discharge status: Home / Self Care | Payer: 59 | Source: Ambulatory Visit | Attending: Obstetrics and Gynecology | Admitting: Obstetrics and Gynecology

## 2021-09-03 DIAGNOSIS — N6320 Unspecified lump in the left breast, unspecified quadrant: Secondary | ICD-10-CM

## 2021-09-03 DIAGNOSIS — N63 Unspecified lump in unspecified breast: Secondary | ICD-10-CM

## 2021-09-06 ENCOUNTER — Other Ambulatory Visit: Payer: Managed Care, Other (non HMO)

## 2021-09-07 ENCOUNTER — Ambulatory Visit
Admission: RE | Admit: 2021-09-07 | Discharge: 2021-09-07 | Discharge status: Home / Self Care | Payer: Managed Care, Other (non HMO) | Source: Ambulatory Visit | Attending: Obstetrics and Gynecology | Admitting: Obstetrics and Gynecology

## 2021-09-07 ENCOUNTER — Ambulatory Visit
Admission: RE | Admit: 2021-09-07 | Discharge: 2021-09-07 | Disposition: A | Discharge status: Home / Self Care | Payer: Managed Care, Other (non HMO) | Source: Ambulatory Visit | Attending: Obstetrics and Gynecology | Admitting: Obstetrics and Gynecology

## 2021-09-07 ENCOUNTER — Other Ambulatory Visit: Payer: Managed Care, Other (non HMO)

## 2021-09-07 DIAGNOSIS — N6320 Unspecified lump in the left breast, unspecified quadrant: Secondary | ICD-10-CM

## 2023-06-02 DIAGNOSIS — E7800 Pure hypercholesterolemia, unspecified: Secondary | ICD-10-CM | POA: Diagnosis not present

## 2023-06-02 DIAGNOSIS — Z23 Encounter for immunization: Secondary | ICD-10-CM | POA: Diagnosis not present

## 2023-06-02 DIAGNOSIS — Z0000 Encounter for general adult medical examination without abnormal findings: Secondary | ICD-10-CM | POA: Diagnosis not present

## 2023-06-02 DIAGNOSIS — Z79899 Other long term (current) drug therapy: Secondary | ICD-10-CM | POA: Diagnosis not present

## 2023-10-06 DIAGNOSIS — N898 Other specified noninflammatory disorders of vagina: Secondary | ICD-10-CM | POA: Diagnosis not present

## 2023-10-06 DIAGNOSIS — Z130 Encounter for screening for diseases of the blood and blood-forming organs and certain disorders involving the immune mechanism: Secondary | ICD-10-CM | POA: Diagnosis not present

## 2023-10-06 DIAGNOSIS — Z1231 Encounter for screening mammogram for malignant neoplasm of breast: Secondary | ICD-10-CM | POA: Diagnosis not present

## 2023-10-06 DIAGNOSIS — Z01419 Encounter for gynecological examination (general) (routine) without abnormal findings: Secondary | ICD-10-CM | POA: Diagnosis not present

## 2023-10-11 DIAGNOSIS — D225 Melanocytic nevi of trunk: Secondary | ICD-10-CM | POA: Diagnosis not present

## 2023-10-11 DIAGNOSIS — L818 Other specified disorders of pigmentation: Secondary | ICD-10-CM | POA: Diagnosis not present

## 2023-10-11 DIAGNOSIS — Z1283 Encounter for screening for malignant neoplasm of skin: Secondary | ICD-10-CM | POA: Diagnosis not present
